# Patient Record
Sex: Male | Born: 1975 | Race: White | Hispanic: No | Marital: Married | Smoking: Never smoker
Health system: Southern US, Community
[De-identification: ages and names within clinical notes are randomized; demographics above are authoritative.]

## PROBLEM LIST (undated history)

## (undated) DIAGNOSIS — N201 Calculus of ureter: Secondary | ICD-10-CM

## (undated) DIAGNOSIS — Z87442 Personal history of urinary calculi: Secondary | ICD-10-CM

## (undated) DIAGNOSIS — N1330 Unspecified hydronephrosis: Secondary | ICD-10-CM

## (undated) DIAGNOSIS — E119 Type 2 diabetes mellitus without complications: Secondary | ICD-10-CM

## (undated) DIAGNOSIS — N200 Calculus of kidney: Secondary | ICD-10-CM

## (undated) DIAGNOSIS — R350 Frequency of micturition: Secondary | ICD-10-CM

## (undated) DIAGNOSIS — R351 Nocturia: Secondary | ICD-10-CM

## (undated) DIAGNOSIS — R3915 Urgency of urination: Secondary | ICD-10-CM

## (undated) HISTORY — DX: Type 2 diabetes mellitus without complications: E11.9

---

## 2003-09-23 HISTORY — PX: OTHER SURGICAL HISTORY: SHX169

## 2010-10-04 ENCOUNTER — Emergency Department (HOSPITAL_COMMUNITY)
Admission: EM | Admit: 2010-10-04 | Discharge: 2010-10-04 | Payer: Self-pay | Source: Home / Self Care | Admitting: Emergency Medicine

## 2010-10-23 HISTORY — PX: BICEPS TENDON REPAIR: SHX566

## 2011-04-08 ENCOUNTER — Encounter: Payer: Self-pay | Admitting: Family Medicine

## 2011-04-08 DIAGNOSIS — B009 Herpesviral infection, unspecified: Secondary | ICD-10-CM | POA: Insufficient documentation

## 2011-09-18 ENCOUNTER — Other Ambulatory Visit (HOSPITAL_COMMUNITY): Payer: Self-pay | Admitting: Urology

## 2011-09-18 ENCOUNTER — Ambulatory Visit (HOSPITAL_COMMUNITY)
Admission: RE | Admit: 2011-09-18 | Discharge: 2011-09-18 | Disposition: A | Discharge status: Home / Self Care | Payer: BC Managed Care – PPO | Source: Ambulatory Visit | Attending: Urology | Admitting: Urology

## 2011-09-18 DIAGNOSIS — N200 Calculus of kidney: Secondary | ICD-10-CM | POA: Insufficient documentation

## 2011-09-18 DIAGNOSIS — R109 Unspecified abdominal pain: Secondary | ICD-10-CM | POA: Insufficient documentation

## 2012-01-13 ENCOUNTER — Other Ambulatory Visit: Payer: Self-pay | Admitting: Urology

## 2012-01-16 ENCOUNTER — Encounter (HOSPITAL_BASED_OUTPATIENT_CLINIC_OR_DEPARTMENT_OTHER): Payer: Self-pay | Admitting: *Deleted

## 2012-01-16 NOTE — Progress Notes (Signed)
NPO AFTER MN. ARRIVES AT 0915. NEEDS HG. MAKE TAKE PERCOCET IF NEEDED W/ SIPS OF WATER.

## 2012-01-19 NOTE — H&P (Addendum)
History of Present Illness          F/u left nephrolithiasis and right ureteral stone.   CT Dec 2012 images reviewed but only the delayed imaging loaded. By report there was a 7 mm Rt UPJ stone, 4 mm Left UPJ stone and large LLP stone. No left obstruction. It's difficult to see the stones because of the contrast. Also there are clips in the region of the left UPJ which make it more difficult to interpret the KUB.   Pt underwent right ESWL Jan 2013 by Dr. Frann Rider for a 7 mm right UPJ stone. He was told the stone broke in to two pieces. KUB Mar 2013 revealed a fragment in right distal ureter and a 27 x 16 mm LLP stone, possible stone at left UPJ.    He had surgery for left UPJ obs and stone about 20 yrs ago. He has a large left flank incision. Dr. Frann Rider also performed this surgery.    He drives a delivery truck -- caskets.   Interval Hx Patient has been having more frequent intermittent right flank pain. The pain is becoming more intense.   KUB Jan 13, 2012 - stone in right distal ureter is advanced slightly toward right ureterovesical junction. Again possible left UPJ stone. Large left lower pole stone stable.  Renal ultrasound - see technician sheet for details, comparison CT December 2012, multiple KUBs-findings bilateral hydronephrosis right greater than left.   Past Medical History Problems  1. History of  Esophageal Reflux 530.81 2. History of  Nephrolithiasis V13.01  Surgical History Problems  1. History of  Appendectomy 2. History of  Arm Incision 3. History of  Kidney Surgery 4. History of  Leg Incision  (Below Knee) 5. History of  Lithotripsy 6. History of  Nose Surgery  Current Meds 1. Oxycodone-Acetaminophen 5-325 MG Oral Tablet; TAKE 1 TO 2 TABLETS EVERY 4 TO 6 HOURS  AS NEEDED FOR PAIN; Therapy: 11Feb2013 to (Evaluate:13Feb2013); Last Rx:11Feb2013 2. Oxycodone-Acetaminophen 7.5-325 MG Oral Tablet; Therapy: 02Jan2013 to 3. Tamsulosin HCl 0.4 MG Oral Capsule; TAKE 1  CAPSULE Daily; Therapy: 11Feb2013 to  (Evaluate:14Jun2013)  Requested for: 15Apr2013; Last Rx:15Apr2013  Allergies Medication  1. Hydrocodone-Acetaminophen CAPS  Family History Problems  1. Family history of  Family Health Status Number Of Children 2 sons and 1 daughter 2. Paternal history of  Hematuria 3. Paternal history of  Nephrolithiasis  Social History Problems  1. Marital History - Currently Married 2. Never A Smoker 3. Occupation: Truck Psychologist, occupational  4. History of  Alcohol Use 5. History of  Caffeine Use 6. History of  Tobacco Use 305.1  Results/Data Urine [Data Includes: Last 1 Day]   23Apr2013  COLOR YELLOW   APPEARANCE CLEAR   SPECIFIC GRAVITY 1.020   pH 5.0   GLUCOSE NEG mg/dL  BILIRUBIN NEG   KETONE NEG mg/dL  BLOOD TRACE   PROTEIN NEG mg/dL  UROBILINOGEN 0.2 mg/dL  NITRITE NEG   LEUKOCYTE ESTERASE NEG   SQUAMOUS EPITHELIAL/HPF NONE SEEN   WBC 0-3 WBC/hpf  RBC 0-3 RBC/hpf  BACTERIA NONE SEEN   CRYSTALS NONE SEEN   CASTS NONE SEEN    Assessment Assessed  1. Nephrolithiasis 592.0 2. Ureteral Stone 592.1 3. Hydronephrosis 591  Plan Nephrolithiasis (592.0), Ureteral Stone (592.1), Hydronephrosis (591)  1. Follow-up Schedule Surgery Office  Follow-up  Done: 23Apr2013  Discussion/Summary  Discuss with the patient and his wife the KUB and renal exam findings. I believe on the right he has a stone  in the distal ureter causing right hydronephrosis obstruction and pain.  On the left the hydronephrosis may be chronic. There may be a small stone in the left UPJ versus clips. In regards to the right ureteral stone and possible left UPJ stone we discussed the nature risks and benefits of right ureteroscopy laser lithotripsy of Stone basket extraction with stent placement. At the same time we could perform a left retrograde pyelogram possible left ureteroscopy laser lithotripsy and stent placement we discussed the left lower pole stone is too big to be  treated with ureteroscope. We discussed alternatives to the right ureteral stone such as continued some passage or shockwave lithotripsy. All questions answered vision looks to proceed with ureteroscopic management as above.  I was able to get the non-contrast images loaded from Dec 2012 CT. Comparing this with the KUB shows likely 8 clips just posterior to the left UPJ with chronic residual dilation of the left pelvis and collecting system. The left side is draining as seen on the CT delayed images.   H&P update: I have reviewed the history and examined the patient today.  There are no changes to the history and physical. He has not passed a stone.  Date: 01/20/2012 Time: 1048 Doyne Keel, MD

## 2012-01-20 ENCOUNTER — Ambulatory Visit (HOSPITAL_BASED_OUTPATIENT_CLINIC_OR_DEPARTMENT_OTHER)
Admission: RE | Admit: 2012-01-20 | Discharge: 2012-01-20 | Disposition: A | Discharge status: Home / Self Care | Payer: BC Managed Care – PPO | Source: Ambulatory Visit | Attending: Urology | Admitting: Urology

## 2012-01-20 ENCOUNTER — Encounter (HOSPITAL_BASED_OUTPATIENT_CLINIC_OR_DEPARTMENT_OTHER)
Admission: RE | Disposition: A | Discharge status: Home / Self Care | Payer: Self-pay | Source: Ambulatory Visit | Attending: Urology

## 2012-01-20 ENCOUNTER — Encounter (HOSPITAL_BASED_OUTPATIENT_CLINIC_OR_DEPARTMENT_OTHER): Payer: Self-pay | Admitting: Anesthesiology

## 2012-01-20 ENCOUNTER — Encounter (HOSPITAL_BASED_OUTPATIENT_CLINIC_OR_DEPARTMENT_OTHER): Payer: Self-pay | Admitting: *Deleted

## 2012-01-20 ENCOUNTER — Ambulatory Visit (HOSPITAL_BASED_OUTPATIENT_CLINIC_OR_DEPARTMENT_OTHER): Payer: BC Managed Care – PPO | Admitting: Anesthesiology

## 2012-01-20 DIAGNOSIS — N201 Calculus of ureter: Secondary | ICD-10-CM | POA: Insufficient documentation

## 2012-01-20 DIAGNOSIS — K219 Gastro-esophageal reflux disease without esophagitis: Secondary | ICD-10-CM | POA: Insufficient documentation

## 2012-01-20 DIAGNOSIS — N1330 Unspecified hydronephrosis: Secondary | ICD-10-CM | POA: Insufficient documentation

## 2012-01-20 DIAGNOSIS — N200 Calculus of kidney: Secondary | ICD-10-CM | POA: Insufficient documentation

## 2012-01-20 HISTORY — DX: Unspecified hydronephrosis: N13.30

## 2012-01-20 HISTORY — DX: Nocturia: R35.1

## 2012-01-20 HISTORY — DX: Calculus of kidney: N20.0

## 2012-01-20 HISTORY — PX: URETEROSCOPY: SHX842

## 2012-01-20 HISTORY — DX: Urgency of urination: R39.15

## 2012-01-20 HISTORY — DX: Personal history of urinary calculi: Z87.442

## 2012-01-20 HISTORY — DX: Calculus of ureter: N20.1

## 2012-01-20 HISTORY — DX: Frequency of micturition: R35.0

## 2012-01-20 SURGERY — URETEROSCOPY
Anesthesia: General | Site: Ureter | Wound class: Clean Contaminated

## 2012-01-20 MED ORDER — LACTATED RINGERS IV SOLN: INTRAVENOUS | Status: DC

## 2012-01-20 MED ORDER — IOHEXOL 350 MG/ML SOLN
INTRAVENOUS | Status: DC | PRN
  Administered 2012-01-20: 10 mL

## 2012-01-20 MED ORDER — FENTANYL CITRATE 0.05 MG/ML IJ SOLN: 25.0000 ug | INTRAMUSCULAR | Status: DC | PRN

## 2012-01-20 MED ORDER — MIDAZOLAM HCL 5 MG/5ML IJ SOLN
INTRAMUSCULAR | Status: DC | PRN
  Administered 2012-01-20: 2 mg via INTRAVENOUS

## 2012-01-20 MED ORDER — LIDOCAINE HCL (CARDIAC) 20 MG/ML IV SOLN
INTRAVENOUS | Status: DC | PRN
  Administered 2012-01-20: 80 mg via INTRAVENOUS

## 2012-01-20 MED ORDER — BELLADONNA ALKALOIDS-OPIUM 16.2-60 MG RE SUPP
RECTAL | Status: DC | PRN
  Administered 2012-01-20: 1 via RECTAL

## 2012-01-20 MED ORDER — PROMETHAZINE HCL 25 MG/ML IJ SOLN: 6.2500 mg | INTRAMUSCULAR | Status: DC | PRN

## 2012-01-20 MED ORDER — SODIUM CHLORIDE 0.9 % IR SOLN
Status: DC | PRN
  Administered 2012-01-20: 5000 mL

## 2012-01-20 MED ORDER — LACTATED RINGERS IV SOLN
INTRAVENOUS | Status: DC
  Administered 2012-01-20: 100 mL/h via INTRAVENOUS

## 2012-01-20 MED ORDER — OXYCODONE-ACETAMINOPHEN 5-325 MG PO TABS
1.0000 | ORAL_TABLET | ORAL | Status: DC | PRN
  Administered 2012-01-20: 1 via ORAL

## 2012-01-20 MED ORDER — CEFAZOLIN SODIUM-DEXTROSE 2-3 GM-% IV SOLR
2.0000 g | INTRAVENOUS | Status: AC
  Administered 2012-01-20: 2 g via INTRAVENOUS

## 2012-01-20 MED ORDER — SCOPOLAMINE 1 MG/3DAYS TD PT72
1.0000 | MEDICATED_PATCH | Freq: Once | TRANSDERMAL | Status: DC
  Administered 2012-01-20: 1.5 mg via TRANSDERMAL

## 2012-01-20 MED ORDER — ONDANSETRON HCL 4 MG/2ML IJ SOLN
INTRAMUSCULAR | Status: DC | PRN
  Administered 2012-01-20: 4 mg via INTRAVENOUS

## 2012-01-20 MED ORDER — LACTATED RINGERS IV SOLN
INTRAVENOUS | Status: DC | PRN
  Administered 2012-01-20: 10:00:00 via INTRAVENOUS

## 2012-01-20 MED ORDER — CEFAZOLIN SODIUM 1-5 GM-% IV SOLN: 1.0000 g | INTRAVENOUS | Status: DC

## 2012-01-20 MED ORDER — FENTANYL CITRATE 0.05 MG/ML IJ SOLN
INTRAMUSCULAR | Status: DC | PRN
  Administered 2012-01-20 (×2): 25 ug via INTRAVENOUS
  Administered 2012-01-20: 50 ug via INTRAVENOUS
  Administered 2012-01-20 (×4): 25 ug via INTRAVENOUS
  Administered 2012-01-20: 50 ug via INTRAVENOUS

## 2012-01-20 MED ORDER — PROPOFOL 10 MG/ML IV EMUL
INTRAVENOUS | Status: DC | PRN
  Administered 2012-01-20: 200 mg via INTRAVENOUS

## 2012-01-20 MED ORDER — CIPROFLOXACIN HCL 500 MG PO TABS: 500.0000 mg | ORAL_TABLET | Freq: Two times a day (BID) | ORAL | Status: AC

## 2012-01-20 MED ORDER — DEXAMETHASONE SODIUM PHOSPHATE 4 MG/ML IJ SOLN
INTRAMUSCULAR | Status: DC | PRN
  Administered 2012-01-20: 8 mg via INTRAVENOUS

## 2012-01-20 MED ORDER — KETOROLAC TROMETHAMINE 30 MG/ML IJ SOLN
INTRAMUSCULAR | Status: DC | PRN
  Administered 2012-01-20: 30 mg via INTRAVENOUS

## 2012-01-20 SURGICAL SUPPLY — 32 items
ADAPTER CATH URET PLST 4-6FR (CATHETERS) IMPLANT
BAG DRAIN URO-CYSTO SKYTR STRL (DRAIN) ×2 IMPLANT
BASKET LASER NITINOL 1.9FR (BASKET) IMPLANT
BASKET STNLS GEMINI 4WIRE 3FR (BASKET) ×2 IMPLANT
BASKET ZERO TIP NITINOL 2.4FR (BASKET) IMPLANT
BRUSH URET BIOPSY 3F (UROLOGICAL SUPPLIES) IMPLANT
CANISTER SUCT LVC 12 LTR MEDI- (MISCELLANEOUS) IMPLANT
CATH INTERMIT  6FR 70CM (CATHETERS) ×2 IMPLANT
CATH URET 5FR 28IN CONE TIP (BALLOONS)
CATH URET 5FR 28IN OPEN ENDED (CATHETERS) IMPLANT
CATH URET 5FR 70CM CONE TIP (BALLOONS) IMPLANT
CLOTH BEACON ORANGE TIMEOUT ST (SAFETY) ×2 IMPLANT
DRAPE CAMERA CLOSED 9X96 (DRAPES) IMPLANT
ELECT REM PT RETURN 9FT ADLT (ELECTROSURGICAL)
ELECTRODE REM PT RTRN 9FT ADLT (ELECTROSURGICAL) IMPLANT
GLOVE BIO SURGEON STRL SZ7.5 (GLOVE) ×2 IMPLANT
GOWN PREVENTION PLUS LG XLONG (DISPOSABLE) ×2 IMPLANT
GOWN STRL REIN XL XLG (GOWN DISPOSABLE) ×2 IMPLANT
GUIDEWIRE 0.038 PTFE COATED (WIRE) IMPLANT
GUIDEWIRE ANG ZIPWIRE 038X150 (WIRE) IMPLANT
GUIDEWIRE STR DUAL SENSOR (WIRE) ×2 IMPLANT
IV NS IRRIG 3000ML ARTHROMATIC (IV SOLUTION) ×6 IMPLANT
KIT BALLIN UROMAX 15FX10 (LABEL) IMPLANT
KIT BALLN UROMAX 15FX4 (MISCELLANEOUS) IMPLANT
KIT BALLN UROMAX 26 75X4 (MISCELLANEOUS)
LASER FIBER DISP (UROLOGICAL SUPPLIES) ×2 IMPLANT
PACK CYSTOSCOPY (CUSTOM PROCEDURE TRAY) ×2 IMPLANT
SET HIGH PRES BAL DIL (LABEL)
SHEATH ACCESS URETERAL 38CM (SHEATH) ×2 IMPLANT
SHEATH URET ACCESS 12FR/35CM (UROLOGICAL SUPPLIES) IMPLANT
SHEATH URET ACCESS 12FR/55CM (UROLOGICAL SUPPLIES) IMPLANT
SYRINGE IRR TOOMEY STRL 70CC (SYRINGE) IMPLANT

## 2012-01-20 NOTE — Anesthesia Procedure Notes (Signed)
Procedure Name: LMA Insertion Date/Time: 01/20/2012 11:05 AM Performed by: Renella Cunas D Pre-anesthesia Checklist: Patient identified, Emergency Drugs available, Suction available and Patient being monitored Patient Re-evaluated:Patient Re-evaluated prior to inductionOxygen Delivery Method: Circle System Utilized Preoxygenation: Pre-oxygenation with 100% oxygen Intubation Type: IV induction Ventilation: Mask ventilation without difficulty LMA: LMA inserted LMA Size: 4.0 Number of attempts: 1 Airway Equipment and Method: bite block Placement Confirmation: positive ETCO2 Tube secured with: Tape Dental Injury: Teeth and Oropharynx as per pre-operative assessment

## 2012-01-20 NOTE — Anesthesia Preprocedure Evaluation (Addendum)
Anesthesia Evaluation  Patient identified by MRN, date of birth, ID band Patient awake    Reviewed: Allergy & Precautions, H&P , NPO status , Patient's Chart, lab work & pertinent test results  Airway Mallampati: III TM Distance: >3 FB Neck ROM: Full    Dental  (+) Teeth Intact and Dental Advisory Given   Pulmonary neg pulmonary ROS,  breath sounds clear to auscultation  Pulmonary exam normal       Cardiovascular negative cardio ROS  Rhythm:Regular Rate:Normal     Neuro/Psych negative neurological ROS  negative psych ROS   GI/Hepatic negative GI ROS, Neg liver ROS,   Endo/Other  Morbid obesity  Renal/GU Bilateral hydronephrosis secondary renal calculi  negative genitourinary   Musculoskeletal negative musculoskeletal ROS (+)   Abdominal (+) + obese,   Peds  Hematology negative hematology ROS (+)   Anesthesia Other Findings   Reproductive/Obstetrics negative OB ROS                          Anesthesia Physical Anesthesia Plan  ASA: II  Anesthesia Plan: General   Post-op Pain Management:    Induction: Intravenous  Airway Management Planned: LMA  Additional Equipment:   Intra-op Plan:   Post-operative Plan: Extubation in OR  Informed Consent: I have reviewed the patients History and Physical, chart, labs and discussed the procedure including the risks, benefits and alternatives for the proposed anesthesia with the patient or authorized representative who has indicated his/her understanding and acceptance.   Dental advisory given  Plan Discussed with: CRNA  Anesthesia Plan Comments:         Anesthesia Quick Evaluation

## 2012-01-20 NOTE — Brief Op Note (Signed)
01/20/2012  11:58 AM  PATIENT:  Dez D Brisbin  36 y.o. male  PRE-OPERATIVE DIAGNOSIS:  BILATERAL HYDRONEPHROSIS AND RIGHT URETERAL STONE  POST-OPERATIVE DIAGNOSIS:  BILATERAL HYDRONEPHROSIS AND RIGHT URETERAL STONE  PROCEDURE:  Procedure(s) (LRB): Right URETEROSCOPY (N/A), right laser lithotripsy stone basket extraction and right ureteral stent placement  Bilateral retrograde pyelogram  SURGEON:  Surgeon(s) and Role:    * Antony Haste, MD - Primary  ANESTHESIA:   general  EBL: Minimal  BLOOD ADMINISTERED:none  DRAINS: Right 6 x 26 cm double-J stent with string  LOCAL MEDICATIONS USED:  NONE  SPECIMEN:  Stone fragment  DISPOSITION OF SPECIMEN:  Lab  COUNTS:  YES  TOURNIQUET:  * No tourniquets in log *  DICTATION: [id number]  PLAN OF CARE: Discharge to home after PACU  PATIENT DISPOSITION:  PACU - hemodynamically stable.   Delay start of Pharmacological VTE agent (>24hrs) due to surgical blood loss or risk of bleeding: not applicable

## 2012-01-20 NOTE — Transfer of Care (Signed)
Immediate Anesthesia Transfer of Care Note  Patient: Lucas Contreras  Procedure(s) Performed: Procedure(s) (LRB): URETEROSCOPY (N/A)  Patient Location: PACU  Anesthesia Type: General  Level of Consciousness: awake, alert  and oriented  Airway & Oxygen Therapy: Patient Spontanous Breathing and Patient connected to face mask oxygen  Post-op Assessment: Report given to PACU RN and Post -op Vital signs reviewed and stable  Post vital signs: Reviewed and stable  Complications: No apparent anesthesia complications

## 2012-01-20 NOTE — Discharge Instructions (Signed)
Right Ureteroscopy and Right Ureteral Stent A ureteral stent is a soft plastic tube with multiple holes. The stent is inserted into a ureter to help drain urine from the kidney into the bladder. The tube has a coil on each end to keep it from falling out. One end stays in the kidney. The other end stays in the bladder. A stent cannot be seen from the outside. Usually it does not keep you from going about normal routines. A ureteral stent is used to bypass a blockage in your kidney or ureter. This blockage can be caused by kidney stones, scar tissue, pregnancy, or other causes. It can also be used during treatment to remove a kidney stone or to let a ureter heal after surgery. The stent allows urine to drain from the kidney into the bladder. It is most often taken out after the blockage has been removed or the ureter has healed. If a stent is needed for a long time, it will be changed every few months. INSERTING THE STENT Your stent is put in by a urologist. This is a medical doctor trained for treating genitourinary (kidney, ureter and bladder) problems. The stent is inserted in a hospital or same day surgical center. You can anticipate going home the same day. PROCEDURE  A special x-ray machine called a fluoroscope is used to guide the insertion of your stent. This allows your doctor to make sure the stent is in the correct place.   First you are given anesthesia to keep you comfortable.   Then your doctor inserts a special lighted instrument called a cystoscope into your bladder. This allows your doctor to see the opening to the ureter.   A thin wire is carefully threaded into the bladder and up the ureter. The stent is inserted over the wire and the wire is then removed.  HOME CARE INSTRUCTIONS   While the stent is in place, you may feel some discomfort. Certain movements may trigger pain or a feeling that you need to urinate. Your caregiver may give you pain medication. Only take over-the-counter  or prescription medicines for pain, discomfort, or fever as directed by your caregiver. Do not take aspirin as this can make bleeding worse.   You may be given medications to prevent infection or bladder spasms. Be sure to take all medications as directed.   Drink plenty of fluids.   You may have small amounts of bleeding causing your urine to be slightly red. This is nothing to be concerned about.   REMOVAL OF THE STENT ***Remove the stent on Thursday, 01/22/2012 by pulling the string was slow steady pressure. Remove the entire stent.***  SEEK IMMEDIATE MEDICAL CARE IF:   Your urine is dark red or has blood clots.   You are incontinent (leaking urine).   You have an oral temperature above 102 F (38.9 C), chills, nausea (feeling sick to your stomach), or vomiting.   Your pain is not relieved by pain medication. Do not take aspirin as this can make bleeding worse.   The end of the stent comes out of the urethra.   Post Anesthesia Home Care Instructions  Activity: Get plenty of rest for the remainder of the day. A responsible adult should stay with you for 24 hours following the procedure.  For the next 24 hours, DO NOT: -Drive a car -Advertising copywriter -Drink alcoholic beverages -Take any medication unless instructed by your physician -Make any legal decisions or sign important papers.  Meals: Start with liquid  foods such as gelatin or soup. Progress to regular foods as tolerated. Avoid greasy, spicy, heavy foods. If nausea and/or vomiting occur, drink only clear liquids until the nausea and/or vomiting subsides. Call your physician if vomiting continues.  Special Instructions/Symptoms: Your throat may feel dry or sore from the anesthesia or the breathing tube placed in your throat during surgery. If this causes discomfort, gargle with warm salt water. The discomfort should disappear within 24 hours.

## 2012-01-20 NOTE — Anesthesia Postprocedure Evaluation (Signed)
Anesthesia Post Note  Patient: Lucas Contreras  Procedure(s) Performed: Procedure(s) (LRB): URETEROSCOPY (N/A)  Anesthesia type: General  Patient location: PACU  Post pain: Pain level controlled  Post assessment: Post-op Vital signs reviewed  Last Vitals:  Filed Vitals:   01/20/12 1212  BP: 138/85  Pulse: 89  Temp: 36.3 C  Resp: 18    Post vital signs: Reviewed  Level of consciousness: sedated  Complications: No apparent anesthesia complications

## 2012-01-21 ENCOUNTER — Encounter (HOSPITAL_BASED_OUTPATIENT_CLINIC_OR_DEPARTMENT_OTHER): Payer: Self-pay | Admitting: Urology

## 2012-01-21 NOTE — Op Note (Signed)
NAMESHAHEER, BONFIELD                 ACCOUNT NO.:  [id number]  MEDICAL RECORD NO.:  [id number]  LOCATION:                               FACILITY:  [facility]  PHYSICIAN:  Jerilee Field, MD   DATE OF BIRTH:  02-02-1976  DATE OF PROCEDURE: DATE OF DISCHARGE:                              OPERATIVE REPORT   PREOPERATIVE DIAGNOSES: 1. Right ureteral stone. 2. Bilateral hydronephrosis.  POSTOPERATIVE DIAGNOSES: 1. Right ureteral stone. 2. Bilateral hydronephrosis.  PROCEDURES: 1. Right ureteroscopy, right laser lithotripsy with stone basket     extraction and right ureteral stent placement. 2. Bilateral retrograde pyelogram.  SURGEON:  Jerilee Field, MD  ANESTHESIA:  General.  INDICATION FOR PROCEDURE:  Mr. Kulikowski is a 36 year old white male, who underwent right extracorporeal shock wave lithotripsy for right proximal stone several months ago.  He has been tolerating stone passage and wanted to pass the residual fragment without the need for further intervention until recently when he has developed intermittent significant right flank pain.  We discussed the nature, risks, and benefits of cystoscopy, right ureteroscopy, laser lithotripsy with stone basket extraction, including alternatives such as continuing on treatment or repeat shockwave lithotripsy.  All questions were answered and he elected to proceed with right ureteroscopy.  On CT scan, the radiologist commented on a left UPJ stone; however, there were 8 metal clips in the region of the UPJ on imaging and KUB and no stones.  The patient has some mild intermittent left flank pain.  He had a UPJ repair on the left as a teenager.  Therefore, we discussed left retrograde pyelogram and left ureteroscopy for diagnostic purposes with possible stone manipulation on the left side as well.  All questions were answered and the patient elected to proceed with all these procedures.  FINDINGS:  On cystoscopy:  The urethra, prostate,  and bladder were normal. On right ureteroscopy:  A sizable 7-mm stone was found at the right ureterovesical junction.  It was quite hard and required laser lithotripsy to break up with the final piece removed with [employer]. Right retrograde pyelogram:  The right ureter appeared to have mild dilation without filling defect or stricture.  The right collecting system and renal pelvis showed mild dilation without filling defect. Left retrograde pyelogram:  The left ureter was normal in appearance without filling defect, stricture, or dilation.  The ureter narrowed at the left ureteropelvic junction with a fine stream of contrast going into the left renal pelvis.  The left collecting system and renal pelvis were mildly dilated, but had no filling defect.  There was a sizable left lower pole stone.  DESCRIPTION OF PROCEDURE:  After consent was obtained, the patient was brought to the operating room.  A time-out was performed to confirm the patient and procedure.  After adequate anesthesia, he was placed in lithotomy position, and prepped and draped in the usual fashion.  A cystoscope was passed per urethra.  Scout imaging was obtained of the right kidney, ureter, and bladder region and a calcification was noted at the right ureterovesical junction.  I attempted to obtain a right retrograde pyelogram with the 6-French catheter would not cannulate the right ureteral  orifice.  Therefore, a Sensor wire was advanced into the right ureterovesical junction and noted to pass along and passed the calcification on x-ray.  The wire was then advanced and coiled up in the right upper pole.  This bladder was drained and the cystoscope was removed.  The semi-rigid ureteroscope was then advanced adjacent to the wire.  The ureterovesical junction was entered and carefully dilated with the scope and just proximal to this, a large jackstone was noted. A photo was taken and a 365 micron laser fiber was  advanced.  At a setting of 0.5 and 15, the stone was fragmented into small pieces. There was 1 final piece that began to move proximally in the ureter. Therefore, it was surrounded with a [employer] and removed.  The ureteroscope was then advanced back into the ureter.  The stone impaction site was quite edematous, but proximal to this the ureter appeared normal without dilation.  A right retrograde pyelogram was obtained with retrograde injection of contrast through the ureteroscope and the remainder of the ureter proximal to the scope appeared normal as previously dictated.  The ureter was then carefully inspected on the way out and apart from some edema where the stone had been impacted, there was no injury or stone fragments remaining.  The scope was then removed. The cystoscope was readvanced into the bladder and the bladder drained. Using a 6-French open-ended catheter, left retrograde pyelogram was obtained as previously dictated on scout imaging of the left kidney, ureter, and bladder.  There was a large calcification noted in the region of the left lower pole on fluoro.  The Sensor wire was then advanced, coiled up in the left upper pole.  It easily went up the ureter through the UPJ and coiled in the upper pole of the left kidney. I attempted to pass the digital ureteroscope over the wire, but it would not advance.  I then attempted to dilate the ureter gently with the inner cannula of an access sheath, this would not advance either. Having reviewed the CT and carefully looked at the KUBs, it was obvious there were 8 clips around the left UPJ and no stones.  It appears there was some residual chronic dilation of the left kidney, which may require surveillance.  However, the left ureteropelvic junction is patent and this was also evidenced on the patient's CT scan in December 2012 when contrast advanced down the ureter on the left in a normal fashion on delayed imaging.  The  right Sensor wire was then backloaded on the cystoscope and a right 6 x 26 cm stent was placed.  The bladder was drained and the scope removed.  A good coil was seen in the kidney and a good coil in the bladder.  The string was left on the stent.  The patient was then awakened, taken to recovery room in stable condition.  ESTIMATED BLOOD LOSS:  Minimal.  COMPLICATIONS:  None.  DRAINS:  Right 6 x 26 cm double-J stent.  SPECIMEN:  Stone fragments, sent to the lab.  DISPOSITION:  Patient stable to PACU.          ______________________________ Jerilee Field, MD    ME/MEDQ  D:  01/20/2012  T:  01/21/2012  Job:  [id number]

## 2012-01-26 ENCOUNTER — Encounter (HOSPITAL_BASED_OUTPATIENT_CLINIC_OR_DEPARTMENT_OTHER): Payer: Self-pay

## 2013-02-18 ENCOUNTER — Encounter: Payer: Self-pay | Admitting: Physician Assistant

## 2013-02-18 ENCOUNTER — Ambulatory Visit (INDEPENDENT_AMBULATORY_CARE_PROVIDER_SITE_OTHER): Payer: BC Managed Care – PPO | Admitting: Physician Assistant

## 2013-02-18 VITALS — BP 136/94 | HR 68 | Temp 97.7°F | Resp 18 | Ht 65.25 in | Wt 278.0 lb

## 2013-02-18 DIAGNOSIS — M79604 Pain in right leg: Secondary | ICD-10-CM

## 2013-02-18 DIAGNOSIS — M79609 Pain in unspecified limb: Secondary | ICD-10-CM

## 2013-02-18 NOTE — Progress Notes (Signed)
   Patient ID: CHEICK SUHR MRN: [id number], DOB: [birth year], 37 y.o. Date of Encounter: 02/18/2013, 4:58 PM    Chief Complaint:  Chief Complaint  Patient presents with  . Pain in right upper thigh     HPI: 37 y.o. year old male reports that about one week ago he noticed some pain near his right knee. Since then, he has noticed that medial aspect of right thigh "felt sore like a bruise" but there was no bruise there. Says he just wanted to get it checked b/c years ago he was hit by a ball in the left leg and developed a hematoma. Wasn't sure if something like that was going to happen. Did not know if had been bitten by a spider or something or what. Has had no itching. No redness, no swelling.    Home Meds: See attached medication section for any medications that were entered at today's visit. The computer does not put those onto this list.The following list is a list of meds entered prior to today's visit.   Current Outpatient Prescriptions on File Prior to Visit  Medication Sig Dispense Refill  . ibuprofen (ADVIL,MOTRIN) 200 MG tablet Take 200 mg by mouth every 6 (six) hours as needed.       No current facility-administered medications on file prior to visit.    Allergies: No Known Allergies    Review of Systems: See HPI for pertinent ROS. All other ROS negative.    Physical Exam: Blood pressure 136/94, pulse 68, temperature 97.7 F (36.5 C), temperature source Oral, resp. rate 18, height 5' 5.25" (1.657 m), weight 278 lb (126.1 kg)., Body mass index is 45.93 kg/(m^2). General: WNWD WM. Appears in no acute distress. Lungs: Clear bilaterally to auscultation without wheezes, rales, or rhonchi. Breathing is unlabored. Heart: Regular rhythm. No murmurs, rubs, or gallops. Msk:  Strength and tone normal for age. Extremities/Skin: Right Knee: Inspection is normal. Right thigh: Inspection is normal. There is no erythema, no swelling, no rash or skin lesion. Palpation: There is  tenderness at area medial and superior to knee. There is also area of tenderness with palpation of medial upper thigh.  Neuro: Alert and oriented X 3. Moves all extremities spontaneously. Gait is normal. CNII-XII grossly in tact. Psych:  Responds to questions appropriately with a normal affect.     ASSESSMENT AND PLAN:  37 y.o. year old male with  1. Right leg pain His pain and symptoms are c/w muscle strain. Rec stretching with lunges etc. F/U if symtoms change or worsen.   [address] [place], [place], [facility] 02/18/2013 4:58 PM

## 2013-06-07 ENCOUNTER — Telehealth: Payer: Self-pay | Admitting: Physician Assistant

## 2013-06-07 NOTE — Telephone Encounter (Signed)
Last OV 02/18/13.  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  Asking for refills??

## 2013-06-07 NOTE — Telephone Encounter (Signed)
Valtrex 1000 mg (pt last got in 2012, but he said that [facility] told him to call at his last visit if he ever needed a refill)

## 2013-06-08 MED ORDER — ONE-DAILY MULTI VITAMINS PO TABS: ORAL_TABLET | ORAL | Status: DC

## 2013-06-08 NOTE — Telephone Encounter (Signed)
Approved Valtrex 1000 mg 2 by mouth every 12 hours x2 doses #4 with 5 refills

## 2013-06-08 NOTE — Telephone Encounter (Signed)
Med sent to pharm..Patient aware

## 2013-06-10 ENCOUNTER — Telehealth: Payer: Self-pay | Admitting: Family Medicine

## 2013-06-10 MED ORDER — VALACYCLOVIR HCL 1 G PO TABS: 2000.0000 mg | ORAL_TABLET | Freq: Two times a day (BID) | ORAL | Status: DC

## 2013-06-10 NOTE — Telephone Encounter (Signed)
Pt aware.

## 2013-06-10 NOTE — Telephone Encounter (Signed)
Please void multivitamin and send in Valtrex. I told the pt we would call him when we got it fixed, so if you want me to call him let me know.

## 2013-06-10 NOTE — Telephone Encounter (Signed)
Voided MVI rx and new Valtrex escribed

## 2013-12-05 ENCOUNTER — Ambulatory Visit (INDEPENDENT_AMBULATORY_CARE_PROVIDER_SITE_OTHER): Payer: BC Managed Care – PPO | Admitting: Family Medicine

## 2013-12-05 ENCOUNTER — Encounter: Payer: Self-pay | Admitting: Family Medicine

## 2013-12-05 VITALS — BP 148/76 | HR 82 | Temp 98.3°F | Resp 16 | Ht 65.0 in | Wt 276.0 lb

## 2013-12-05 DIAGNOSIS — F411 Generalized anxiety disorder: Secondary | ICD-10-CM | POA: Insufficient documentation

## 2013-12-05 DIAGNOSIS — G4700 Insomnia, unspecified: Secondary | ICD-10-CM

## 2013-12-05 DIAGNOSIS — J019 Acute sinusitis, unspecified: Secondary | ICD-10-CM | POA: Insufficient documentation

## 2013-12-05 DIAGNOSIS — F39 Unspecified mood [affective] disorder: Secondary | ICD-10-CM | POA: Insufficient documentation

## 2013-12-05 MED ORDER — FLUTICASONE PROPIONATE 50 MCG/ACT NA SUSP
2.0000 | Freq: Every day | NASAL | Status: DC
Start: 2013-12-05 — End: 2020-12-11

## 2013-12-05 MED ORDER — AMOXICILLIN 500 MG PO CAPS: 500.0000 mg | ORAL_CAPSULE | Freq: Three times a day (TID) | ORAL | Status: DC

## 2013-12-05 NOTE — Assessment & Plan Note (Signed)
Would try melatonin for sleep

## 2013-12-05 NOTE — Patient Instructions (Addendum)
Melatonin start tonight 1-2 capsules, take 1 hour before bedtime I will check into the happy camper and another alternative medication Take antibiotics / flonase, Mucinex  F/U As needed

## 2013-12-05 NOTE — Progress Notes (Signed)
Patient ID: Lucas Contreras, male   DOB: 09/15/1976, 38 y.o.   MRN: [id number]   Subjective:    Patient ID: Lucas Berkshireocky D Contreras, male    DOB: 04/09/1976, 38 y.o.   MRN: [id number]  Patient presents for Illness and anxiety issues  patient here with sinus drainage pressure for the past 3 weeks. He's also had subjective fever. He had some cough from postnasal drip. He he has tried some over-the-counter medications with minimal improvement  He is also here about anxiety and stress. He works in a [employer] business and is on call 24 hours a day he is working like this for the past 6 years. His mood has worsened over past 3-6 months. Anytime his phone rings he actually becomes very angry and upset he feels like he just wants to throw his business phone. He is now more jumpy and very curable at home. He is an 38 year old son and he has noticed his other reactions to his son new onset. His wife is also concerned because he seems stressed and angry all the time. His sleep is also impaired and he will only sleep a good 4 -5 hours a night. He does not want to take any prescription medications because of all the side effects he did inquire about an herbal called happy camper/    Review Of Systems:  GEN- denies fatigue, fever, weight loss,weakness, recent illness HEENT- denies eye drainage, change in vision,+ nasal discharge, CVS- denies chest pain, palpitations RESP- denies SOB, +cough, wheeze Neuro- denies headache, dizziness, syncope, seizure activity       Objective:    BP 148/76  Pulse 82  Temp(Src) 98.3 F (36.8 C)  Resp 16  Ht 5\' 5"  (1.651 m)  Wt 276 lb (125.193 kg)  BMI 45.93 kg/m2  GEN- NAD, alert and oriented x3 HEENT- PERRL, EOMI, non injected sclera, pink conjunctiva, MMM, oropharynx mild injection, TM clear bilat no effusion,  + maxillary sinus tenderness, inflammed turbinates, + Nasal drainage  Neck- Supple, no LAD CVS- RRR, no murmur RESP-CTAB EXT- No edema Pulses- Radial 2+ Psych-  normal affect and mood, well groomed, polite no apparent SI         Assessment & Plan:      Problem List Items Addressed This Visit   None      Note: This dictation was prepared with Dragon dictation along with smaller phrase technology. Any transcriptional errors that result from this process are unintentional.

## 2013-12-05 NOTE — Assessment & Plan Note (Signed)
His current state waited around his job which unfortunately we cannot change at this time. We did discuss therapy versus medications and he wants to try something more natural. I will look into the herbals that he mentioned to see if this is something that he would benefit from. I did discuss the typical as SSRIs as well as SNRI with him and he wishes to hold off at this time, he has a strong family history of addiction therefore does not want any addictive medication such as benzodiazepines which I agree with

## 2013-12-05 NOTE — Assessment & Plan Note (Signed)
Treat with Amox, flonase, mucinex based on duration , no improvement in symptoms

## 2013-12-11 ENCOUNTER — Telehealth: Payer: Self-pay | Admitting: Family Medicine

## 2013-12-11 NOTE — Telephone Encounter (Signed)
Please call pt  I spoke with one of the alternative medicine physicians, I do not have any good evidence behind any of these herbal medications as they are not prescribed medications  He recommends an herbal call Ashwaganda for stress and mood- it is found on [employer] , another is [employer]m- comes in a tea and a capsule, I am not sure of any specific side effects with these   Continue Melatonin for sleep this is safe   Regarding the happy camper is does not have marijuana in it, it does have an herb called KAVA KAVA in it which is known to cause some liver damage in some people, so if does try this he needs to have his liver checked after 6 weeks, then periodically.   If he wants to try prescription meds instead let us know

## 2013-12-12 NOTE — Telephone Encounter (Signed)
LMTRC

## 2013-12-13 ENCOUNTER — Other Ambulatory Visit: Payer: Self-pay | Admitting: *Deleted

## 2013-12-13 DIAGNOSIS — Z0000 Encounter for general adult medical examination without abnormal findings: Secondary | ICD-10-CM

## 2013-12-13 NOTE — Telephone Encounter (Signed)
Call placed to patient and patient made aware.   Reports that he has been taking Happy Camper x 3 days and he does notice improvement.   Advised to come in for Liver panel this week.   Liver panel ordered.

## 2014-08-30 ENCOUNTER — Ambulatory Visit (INDEPENDENT_AMBULATORY_CARE_PROVIDER_SITE_OTHER): Payer: BC Managed Care – PPO | Admitting: Physician Assistant

## 2014-08-30 ENCOUNTER — Encounter: Payer: Self-pay | Admitting: Physician Assistant

## 2014-08-30 VITALS — BP 112/94 | HR 84 | Temp 98.1°F | Resp 18 | Ht 67.0 in | Wt 287.0 lb

## 2014-08-30 DIAGNOSIS — F411 Generalized anxiety disorder: Secondary | ICD-10-CM | POA: Insufficient documentation

## 2014-08-30 MED ORDER — SERTRALINE HCL 50 MG PO TABS: 50.0000 mg | ORAL_TABLET | Freq: Every day | ORAL | Status: DC

## 2014-08-30 NOTE — Progress Notes (Signed)
Patient ID: Lucas BerkshireRocky D Contreras MRN: [id number], DOB: 03/27/1976, 38 y.o. Date of Encounter: @DATE @  Chief Complaint:  Chief Complaint  Patient presents with  . Anxiety    HPI: 38 y.o. year old white male  presents for follow-up office visit to discuss anxiety/stress.  He says that when he was here to see Dr. Jeanice Limurham in March for a sinus infection that he discussed this with her. I have reviewed her note today as well.  He works with a [employer] business and is on call 24 hours a day. He says that at work he gets upset with his coworkers and yells at them and goes off on them. Says after the fact he realizes that he should've handled the situation in a better way.  He says that his son is very active with sports. He says the baseball season is about to start up and he knows that he needs to get on som, treatment prior to that. Says that in the past he has gotten into altercations and has yelled and screamed at baseball games and has gotten into confrontations with referrees.  Again he says that after the fact he realizes that he was being inappropriate. Says that just when he gets into these situations, things come out without him unable to control it.  Says that he scheduled this visit because he is afraid that he is going to lose his job or end up with a divorce if he does not to do something.  Says that when he talked to Dr. Jeanice Limurham about this at the visit in March he was scared to take prescription medications. He went to [employer] and used an herbal medication but this did not work. He says that he is still very scared of taking any type of medications but definitely does not want to lose his wife or lose his job.  Says that he now realizes that he is also snapping on his wife and his son over very minor things. At work he says that he feels that his coworkers do not take things seriously. Says that he has to have that job for financial income. Says that he gets upset because one of them  and is older and is about to be retired so he feels that that worker does not have to take things seriously because he is not dependent on the income. Says that the other worker [place]".   Says that just yesterday he came home from work upset and as soon as his wife saw him she discussed the fact " he had had a bad day"  He says that his wife was going to come with him to the appointment today that she ended up having to go to the dentist herself.  Pt  says that he feels that all of this is stemming from feeling stressed and anxious all the time.  He says that he has gained 40 pounds over the past year and he is going to work on diet and exercise  But knows that he needs to get on this medication.   Past Medical History  Diagnosis Date  . Hydronephrosis, bilateral   . Right ureteral stone   . Bilateral kidney stones   . Frequency of urination   . Urgency of urination   . Hematuria   . Nocturia   . History of kidney stones      Home Meds: Outpatient Prescriptions Prior to Visit  Medication Sig Dispense Refill  .  fluticasone (FLONASE) 50 MCG/ACT nasal spray Place 2 sprays into both nostrils daily. 16 g 1  . ibuprofen (ADVIL,MOTRIN) 200 MG tablet Take 200 mg by mouth every 6 (six) hours as needed.    . Multiple Vitamin (MULTIVITAMIN) tablet 2 tabs po q 12 hours x 2 doses (Patient not taking: Reported on 08/30/2014) 4 tablet 5  . amoxicillin (AMOXIL) 500 MG capsule Take 1 capsule (500 mg total) by mouth 3 (three) times daily. 30 capsule 0   No facility-administered medications prior to visit.    Allergies: No Known Allergies  History   Social History  . Marital Status: Married    Spouse Name: N/A    Number of Children: N/A  . Years of Education: N/A   Occupational History  . Not on file.   Social History Main Topics  . Smoking status: Never Smoker   . Smokeless tobacco: Never Used  . Alcohol Use: No  . Drug Use: No  . Sexual Activity: Not on file   Other  Topics Concern  . Not on file   Social History Narrative    History reviewed. No pertinent family history.   Review of Systems:  See HPI for pertinent ROS. All other ROS negative.    Physical Exam: Blood pressure 112/94, pulse 84, temperature 98.1 F (36.7 C), temperature source Oral, resp. rate 18, height 5\' 7"  (1.702 m), weight 287 lb (130.182 kg)., Body mass index is 44.94 kg/(m^2). General: Overweight WM. Appears in no acute distress. Neck: Supple. No thyromegaly. No lymphadenopathy. Lungs: Clear bilaterally to auscultation without wheezes, rales, or rhonchi. Breathing is unlabored. Heart: RRR with S1 S2. No murmurs, rubs, or gallops. Musculoskeletal:  Strength and tone normal for age. Extremities/Skin: Warm and dry. Neuro: Alert and oriented X 3. Moves all extremities spontaneously. Gait is normal. CNII-XII grossly in tact. Psych:  Responds to questions appropriately with a normal affect.     ASSESSMENT AND PLAN:  38 y.o. year old male with  1. Generalized anxiety disorder - sertraline (ZOLOFT) 50 MG tablet; Take 1 tablet (50 mg total) by mouth daily.  Dispense: 30 tablet; Refill: 1 I discussed proper expectations of these medication with patient. Discussed that it will take weeks for it to start to take effect. Told him to call me immediately if he thinks it is causing adverse effects. Otherwise just continue to take it daily until he has his follow-up appointment with me which will be 6 weeks.  Signed, [address]Mary Beth HavanaDixon, [place], [facility] 08/30/2014 10:44 AM

## 2014-10-12 ENCOUNTER — Ambulatory Visit: Payer: BC Managed Care – PPO | Admitting: Physician Assistant

## 2014-10-18 ENCOUNTER — Ambulatory Visit (INDEPENDENT_AMBULATORY_CARE_PROVIDER_SITE_OTHER): Payer: BLUE CROSS/BLUE SHIELD | Admitting: Physician Assistant

## 2014-10-18 ENCOUNTER — Encounter: Payer: Self-pay | Admitting: Physician Assistant

## 2014-10-18 VITALS — BP 140/92 | HR 72 | Temp 98.1°F | Resp 18 | Wt 285.0 lb

## 2014-10-18 DIAGNOSIS — K219 Gastro-esophageal reflux disease without esophagitis: Secondary | ICD-10-CM

## 2014-10-18 DIAGNOSIS — F411 Generalized anxiety disorder: Secondary | ICD-10-CM

## 2014-10-18 MED ORDER — SERTRALINE HCL 100 MG PO TABS: 100.0000 mg | ORAL_TABLET | Freq: Every day | ORAL | Status: DC

## 2014-10-18 MED ORDER — OMEPRAZOLE 20 MG PO CPDR: 20.0000 mg | DELAYED_RELEASE_CAPSULE | Freq: Every day | ORAL | Status: AC

## 2014-10-18 NOTE — Progress Notes (Signed)
Patient ID: Lucas Contreras MRN: [id number], DOB: 05/11/1976, 39 y.o. Date of Encounter: @DATE @  Chief Complaint:  Chief Complaint  Patient presents with  . 6 week follow up    is fasting    HPI: 39 y.o. year old white male  presents for follow-up office visit to discuss anxiety/stress.    AT NEXT OV--DISCUSS TETANUS VACCINE---   THE FOLLOWING IS COPIED FROM HIS OV NOTE 08/30/2014:  He says that when he was here to see Dr. Jeanice Limurham in March for a sinus infection that he discussed this with her. I have reviewed her note today as well.  He works with a [employer] business and is on call 24 hours a day. He says that at work he gets upset with his coworkers and yells at them and goes off on them. Says after the fact he realizes that he should've handled the situation in a better way.  He says that his son is very active with sports. He says the baseball season is about to start up and he knows that he needs to get on som, treatment prior to that. Says that in the past he has gotten into altercations and has yelled and screamed at baseball games and has gotten into confrontations with referrees.  Again he says that after the fact he realizes that he was being inappropriate. Says that just when he gets into these situations, things come out without him unable to control it.  Says that he scheduled this visit because he is afraid that he is going to lose his job or end up with a divorce if he does not to do something.  Says that when he talked to Dr. Jeanice Limurham about this at the visit in March he was scared to take prescription medications. He went to [employer] and used an herbal medication but this did not work. He says that he is still very scared of taking any type of medications but definitely does not want to lose his wife or lose his job.  Says that he now realizes that he is also snapping on his wife and his son over very minor things. At work he says that he feels that his coworkers do not  take things seriously. Says that he has to have that job for financial income. Says that he gets upset because one of them and is older and is about to be retired so he feels that that worker does not have to take things seriously because he is not dependent on the income. Says that the other worker is "[place]".   Says that just yesterday he came home from work upset and as soon as his wife saw him she discussed the fact " he had had a bad day"  He says that his wife was going to come with him to the appointment today that she ended up having to go to the dentist herself.  Pt  says that he feels that all of this is stemming from feeling stressed and anxious all the time.  He says that he has gained 40 pounds over the past year and he is going to work on diet and exercise  But knows that he needs to get on this medication.  At the 08/30/2014 office visit, I prescribe Zoloft 50 mg once a day.  TODAY---10/18/2014---HE REPORTS: Today he reports that he definitely sees an improvement since being on the Zoloft. Says that both his wife and his son have made comments.  His wife has noticed that he does not get nearly as angry and upset about things as he did in the past. He says that his son has even told him that he notices that he smiles more now and has made comments of noticing changes as well. Patient says that at work he definitely has had decreased frequency of episodes of feeling angry and stressed and anxious. Also the severity of the symptoms are much reduced compared to what they were prior to the medication. Also says that he has noticed that his sleep is improved since being on this medication.  Says that for the first week or so he felt a little bit sick feeling but then those symptoms subsided. Says that he has had 3 episodes of GERD symptoms at night over the past couple of months.   Does not know whether this is secondary to the Zoloft or not-- but says that he was not having these  episodes prior to the past couple months. Says he usually does not eat within a couple hours of going to bed.   Past Medical History  Diagnosis Date  . Hydronephrosis, bilateral   . Right ureteral stone   . Bilateral kidney stones   . Frequency of urination   . Urgency of urination   . Hematuria   . Nocturia   . History of kidney stones      Home Meds: Outpatient Prescriptions Prior to Visit  Medication Sig Dispense Refill  . ibuprofen (ADVIL,MOTRIN) 200 MG tablet Take 200 mg by mouth every 6 (six) hours as needed.    . sertraline (ZOLOFT) 50 MG tablet Take 1 tablet (50 mg total) by mouth daily. 30 tablet 1  . fluticasone (FLONASE) 50 MCG/ACT nasal spray Place 2 sprays into both nostrils daily. (Patient not taking: Reported on 10/18/2014) 16 g 1  . Multiple Vitamin (MULTIVITAMIN) tablet 2 tabs po q 12 hours x 2 doses (Patient not taking: Reported on 08/30/2014) 4 tablet 5   No facility-administered medications prior to visit.    Allergies: No Known Allergies  History   Social History  . Marital Status: Married    Spouse Name: N/A    Number of Children: N/A  . Years of Education: N/A   Occupational History  . Not on file.   Social History Main Topics  . Smoking status: Never Smoker   . Smokeless tobacco: Never Used  . Alcohol Use: No  . Drug Use: No  . Sexual Activity: Not on file   Other Topics Concern  . Not on file   Social History Narrative    History reviewed. No pertinent family history.   Review of Systems:  See HPI for pertinent ROS. All other ROS negative.    Physical Exam: Blood pressure 140/92, pulse 72, temperature 98.1 F (36.7 C), temperature source Oral, resp. rate 18, weight 285 lb (129.275 kg)., Body mass index is 44.63 kg/(m^2). General: Overweight WM. Appears in no acute distress. Neck: Supple. No thyromegaly. No lymphadenopathy. Lungs: Clear bilaterally to auscultation without wheezes, rales, or rhonchi. Breathing is unlabored. Heart:  RRR with S1 S2. No murmurs, rubs, or gallops. Abdomen: Soft. No tenderness with palpation, even in epigastric region. No masses or organomegaly. Musculoskeletal:  Strength and tone normal for age. Extremities/Skin: Warm and dry. Neuro: Alert and oriented X 3. Moves all extremities spontaneously. Gait is normal. CNII-XII grossly in tact. Psych:  Responds to questions appropriately with a normal affect.     ASSESSMENT AND PLAN:  39 y.o. year old male with    1. Generalized anxiety disorder Will increase Zoloft to 100 mg.  Plan for follow-up office visit in 2 months.  Follow-up sooner if has any adverse effects from the medication or as if feeling worse in general. - sertraline (ZOLOFT) 100 MG tablet; Take 1 tablet (100 mg total) by mouth daily.  Dispense: 30 tablet; Refill: 3  2. Gastroesophageal reflux disease, esophagitis presence not specified Discussed the fact that he reported 20 pound weight gain at his last visit. Discussed that the GERD may be coming from this weight gain. Also discussed to make sure not to eat or drink for several hours prior to lying down to sleep. Will go ahead and send in prescription for omeprazole. Recommend that he take it daily for 1 week then decrease to  PRN. - omeprazole (PRILOSEC) 20 MG capsule; Take 1 capsule (20 mg total) by mouth daily.  Dispense: 30 capsule; Refill: 3   AT NEXT OV---DISCUSS TETANUS VACCINE  Signed, [address] [place], [place], [facility] 10/18/2014 8:54 AM

## 2014-12-25 ENCOUNTER — Ambulatory Visit: Payer: BLUE CROSS/BLUE SHIELD | Admitting: Physician Assistant

## 2015-01-31 ENCOUNTER — Other Ambulatory Visit: Payer: Self-pay | Admitting: Family Medicine

## 2015-02-26 ENCOUNTER — Other Ambulatory Visit: Payer: Self-pay | Admitting: Physician Assistant

## 2015-02-26 NOTE — Telephone Encounter (Signed)
Medication filled x1 with no refills.   Requires office visit before any further refills can be given.   Letter sent.  

## 2017-01-28 ENCOUNTER — Other Ambulatory Visit: Payer: Self-pay | Admitting: Physician Assistant

## 2019-05-10 ENCOUNTER — Other Ambulatory Visit: Payer: Self-pay

## 2019-05-10 DIAGNOSIS — Z20822 Contact with and (suspected) exposure to covid-19: Secondary | ICD-10-CM

## 2019-05-11 LAB — NOVEL CORONAVIRUS, NAA: SARS-CoV-2, NAA: NOT DETECTED

## 2019-07-28 ENCOUNTER — Other Ambulatory Visit: Payer: Self-pay

## 2019-07-28 DIAGNOSIS — Z20822 Contact with and (suspected) exposure to covid-19: Secondary | ICD-10-CM

## 2019-07-29 LAB — NOVEL CORONAVIRUS, NAA: SARS-CoV-2, NAA: NOT DETECTED

## 2019-09-17 DIAGNOSIS — Z20828 Contact with and (suspected) exposure to other viral communicable diseases: Secondary | ICD-10-CM | POA: Diagnosis not present

## 2019-09-17 DIAGNOSIS — J3489 Other specified disorders of nose and nasal sinuses: Secondary | ICD-10-CM | POA: Diagnosis not present

## 2019-09-19 DIAGNOSIS — Z03818 Encounter for observation for suspected exposure to other biological agents ruled out: Secondary | ICD-10-CM | POA: Diagnosis not present

## 2019-09-19 DIAGNOSIS — Z20828 Contact with and (suspected) exposure to other viral communicable diseases: Secondary | ICD-10-CM | POA: Diagnosis not present

## 2019-09-19 DIAGNOSIS — U071 COVID-19: Secondary | ICD-10-CM | POA: Diagnosis not present

## 2019-09-26 ENCOUNTER — Other Ambulatory Visit: Payer: Self-pay

## 2019-09-26 ENCOUNTER — Ambulatory Visit: Payer: BC Managed Care – PPO | Attending: Internal Medicine

## 2019-09-26 DIAGNOSIS — Z20822 Contact with and (suspected) exposure to covid-19: Secondary | ICD-10-CM

## 2019-09-28 LAB — NOVEL CORONAVIRUS, NAA: SARS-CoV-2, NAA: NOT DETECTED

## 2020-07-24 ENCOUNTER — Ambulatory Visit: Payer: BC Managed Care – PPO | Admitting: Family Medicine

## 2020-07-26 ENCOUNTER — Other Ambulatory Visit: Payer: Self-pay

## 2020-07-26 ENCOUNTER — Ambulatory Visit (INDEPENDENT_AMBULATORY_CARE_PROVIDER_SITE_OTHER): Payer: BC Managed Care – PPO | Admitting: Family Medicine

## 2020-07-26 VITALS — BP 140/92 | HR 89 | Temp 97.7°F | Ht 67.0 in | Wt 268.0 lb

## 2020-07-26 DIAGNOSIS — L209 Atopic dermatitis, unspecified: Secondary | ICD-10-CM | POA: Diagnosis not present

## 2020-07-26 DIAGNOSIS — E118 Type 2 diabetes mellitus with unspecified complications: Secondary | ICD-10-CM

## 2020-07-26 MED ORDER — TRIAMCINOLONE ACETONIDE 0.1 % EX CREA: 1.0000 "application " | TOPICAL_CREAM | Freq: Two times a day (BID) | CUTANEOUS | 1 refills | Status: DC

## 2020-07-26 NOTE — Progress Notes (Signed)
Subjective:    Patient ID: Lucas Contreras, male    DOB: 11-Oct-1975, 44 y.o.   MRN: [id number]  HPI  Patient is a 44 year old Caucasian gentleman here today to establish care with me.  He has previously been diagnosed with diabetes and was on Metformin.  He is not sure of the dose however he is only taking it once a day.  He believes he is on 1000 mg once a day.  He is not sure how his previous doctor wanted him to take it.  He is not on an ACE inhibitor or an angiotensin receptor blocker.  He states that he does not know if he has ever had his urine checked for microalbuminuria.  He is not on a statin.  He states that he tried Lipitor in the past but it made him feel fatigued.  His primary reason to come today was to have a rash evaluated on his anterior shins.  On both anterior shins although the right is worse than the left, he has an erythematous scaly rash.  The rash is irregularly shaped.  There are plaques with irregular borders.  There is a papulosquamous eruption within the plaque.  Each of the plaques are approximately 1.5 to 2 cm in diameter.  There are 3 separate plaques on the anterior surface of the right leg and 1 separate plaque on the anterior surface of the left leg.  It does not appear to be psoriasis but I am suspicious of nummular eczema Past Medical History:  Diagnosis Date  . Bilateral kidney stones   . Frequency of urination   . Hematuria   . History of kidney stones   . Hydronephrosis, bilateral   . Nocturia   . Right ureteral stone   . Urgency of urination    Past Surgical History:  Procedure Laterality Date  . BICEPS TENDON REPAIR  FEB 2012  . REMOVAL CYST, LEFT LOWER LEG  2005  . URETEROSCOPY  01/20/2012   Procedure: URETEROSCOPY;  Surgeon: Antony Haste, MD;  Location: [facility];  Service: Urology;  Laterality: N/A;  BILATERAL URETEROSCOPY, LASER LITHO AND RIGHT URETERAL STENTS  LASER CARM LASER AND RAD TECH OK PER MAIN    Current  Outpatient Medications on File Prior to Visit  Medication Sig Dispense Refill  . ibuprofen (ADVIL,MOTRIN) 200 MG tablet Take 200 mg by mouth every 6 (six) hours as needed.    . metFORMIN (GLUCOPHAGE) 1000 MG tablet Take 1,000 mg by mouth daily with breakfast.    . omeprazole (PRILOSEC) 20 MG capsule Take 1 capsule (20 mg total) by mouth daily. 30 capsule 3  . sertraline (ZOLOFT) 100 MG tablet TAKE 1 TABLET (100 MG TOTAL) BY MOUTH DAILY. 30 tablet 0  . valACYclovir (VALTREX) 1000 MG tablet TAKE 2 TABLETS BY MOUTH TWICE A DAY 4 tablet 0  . fluticasone (FLONASE) 50 MCG/ACT nasal spray Place 2 sprays into both nostrils daily. (Patient not taking: Reported on 10/18/2014) 16 g 1  . Multiple Vitamin (MULTIVITAMIN) tablet 2 tabs po q 12 hours x 2 doses (Patient not taking: Reported on 08/30/2014) 4 tablet 5   No current facility-administered medications on file prior to visit.   No Known Allergies Social History   Socioeconomic History  . Marital status: Married    Spouse name: Not on file  . Number of children: Not on file  . Years of education: Not on file  . Highest education level: Not on file  Occupational History  .  Not on file  Tobacco Use  . Smoking status: Never Smoker  . Smokeless tobacco: Never Used  Substance and Sexual Activity  . Alcohol use: No  . Drug use: No  . Sexual activity: Not on file  Other Topics Concern  . Not on file  Social History Narrative  . Not on file   Social Determinants of Health   Financial Resource Strain:   . Difficulty of Paying Living Expenses: Not on file  Food Insecurity:   . Worried About Programme researcher, broadcasting/film/video in the Last Year: Not on file  . Ran Out of Food in the Last Year: Not on file  Transportation Needs:   . Lack of Transportation (Medical): Not on file  . Lack of Transportation (Non-Medical): Not on file  Physical Activity:   . Days of Exercise per Week: Not on file  . Minutes of Exercise per Session: Not on file  Stress:   .  Feeling of Stress : Not on file  Social Connections:   . Frequency of Communication with Friends and Family: Not on file  . Frequency of Social Gatherings with Friends and Family: Not on file  . Attends Religious Services: Not on file  . Active Member of Clubs or Organizations: Not on file  . Attends Banker Meetings: Not on file  . Marital Status: Not on file  Intimate Partner Violence:   . Fear of Current or Ex-Partner: Not on file  . Emotionally Abused: Not on file  . Physically Abused: Not on file  . Sexually Abused: Not on file     Review of Systems  All other systems reviewed and are negative.      Objective:   Physical Exam Vitals reviewed.  Constitutional:      Appearance: Normal appearance. He is obese.  Cardiovascular:     Rate and Rhythm: Normal rate and regular rhythm.     Pulses: Normal pulses.     Heart sounds: Normal heart sounds.  Pulmonary:     Effort: Pulmonary effort is normal. No respiratory distress.     Breath sounds: Normal breath sounds. No wheezing, rhonchi or rales.  Abdominal:     General: Abdomen is flat. Bowel sounds are normal.     Palpations: Abdomen is soft.  Musculoskeletal:     Right lower leg: No edema.     Left lower leg: No edema.  Skin:    Findings: Rash present. Rash is macular, papular and scaling. Rash is not nodular, purpuric, pustular, urticarial or vesicular.       Neurological:     Mental Status: He is alert.           Assessment & Plan:  Controlled type 2 diabetes mellitus with complication, without long-term current use of insulin (HCC) - Plan: CBC with Differential/Platelet, COMPLETE METABOLIC PANEL WITH GFR, Hemoglobin A1c, Microalbumin, urine  Atopic dermatitis, unspecified type  Regarding his diabetes I will check an A1c today.  Goal A1c is less than 6.5.  Will likely need to encourage the patient to take this medication twice a day.  Also recommended an aspirin 81 mg daily.  If his A1c is greater  than 6.5 I would recommend trying the patient on a different statin such as Crestor 10 mg a day.  I will also check a urine microalbumin and if elevated would recommend an ACE inhibitor.  Treat atopic dermatitis with triamcinolone cream twice daily for 7 to 14 days and recheck if no better after  14 days.  Recommended a flu shot but he declined.  Patient has already had his Covid vaccine.

## 2020-07-27 ENCOUNTER — Other Ambulatory Visit: Payer: Self-pay

## 2020-07-27 DIAGNOSIS — E1159 Type 2 diabetes mellitus with other circulatory complications: Secondary | ICD-10-CM

## 2020-07-27 DIAGNOSIS — E118 Type 2 diabetes mellitus with unspecified complications: Secondary | ICD-10-CM

## 2020-07-27 DIAGNOSIS — I152 Hypertension secondary to endocrine disorders: Secondary | ICD-10-CM

## 2020-07-27 LAB — CBC WITH DIFFERENTIAL/PLATELET
Absolute Monocytes: 410 cells/uL (ref 200–950)
Basophils Absolute: 40 cells/uL (ref 0–200)
Basophils Relative: 0.7 %
Eosinophils Absolute: 257 cells/uL (ref 15–500)
Eosinophils Relative: 4.5 %
HCT: 46.2 % (ref 38.5–50.0)
Hemoglobin: 15 g/dL (ref 13.2–17.1)
Lymphs Abs: 2314 cells/uL (ref 850–3900)
MCH: 28 pg (ref 27.0–33.0)
MCHC: 32.5 g/dL (ref 32.0–36.0)
MCV: 86.2 fL (ref 80.0–100.0)
MPV: 10.2 fL (ref 7.5–12.5)
Monocytes Relative: 7.2 %
Neutro Abs: 2679 cells/uL (ref 1500–7800)
Neutrophils Relative %: 47 %
Platelets: 270 10*3/uL (ref 140–400)
RBC: 5.36 10*6/uL (ref 4.20–5.80)
RDW: 12.7 % (ref 11.0–15.0)
Total Lymphocyte: 40.6 %
WBC: 5.7 10*3/uL (ref 3.8–10.8)

## 2020-07-27 LAB — HEMOGLOBIN A1C
Hgb A1c MFr Bld: 10.8 % of total Hgb — ABNORMAL HIGH (ref <5.7)
Mean Plasma Glucose: 263 (calc)
eAG (mmol/L): 14.6 (calc)

## 2020-07-27 LAB — COMPLETE METABOLIC PANEL WITH GFR
AG Ratio: 2 (calc) (ref 1.0–2.5)
ALT: 48 U/L — ABNORMAL HIGH (ref 9–46)
AST: 29 U/L (ref 10–40)
Albumin: 4.5 g/dL (ref 3.6–5.1)
Alkaline phosphatase (APISO): 81 U/L (ref 36–130)
BUN: 18 mg/dL (ref 7–25)
CO2: 28 mmol/L (ref 20–32)
Calcium: 9.4 mg/dL (ref 8.6–10.3)
Chloride: 96 mmol/L — ABNORMAL LOW (ref 98–110)
Creat: 0.85 mg/dL (ref 0.60–1.35)
GFR, Est African American: 123 mL/min/{1.73_m2} (ref >=60)
GFR, Est Non African American: 106 mL/min/{1.73_m2} (ref >=60)
Globulin: 2.3 g/dL (calc) (ref 1.9–3.7)
Glucose, Bld: 308 mg/dL — ABNORMAL HIGH (ref 65–99)
Potassium: 4.7 mmol/L (ref 3.5–5.3)
Sodium: 136 mmol/L (ref 135–146)
Total Bilirubin: 0.6 mg/dL (ref 0.2–1.2)
Total Protein: 6.8 g/dL (ref 6.1–8.1)

## 2020-07-27 LAB — MICROALBUMIN, URINE: Microalb, Ur: 4.5 mg/dL

## 2020-07-27 MED ORDER — LISINOPRIL 10 MG PO TABS: 10.0000 mg | ORAL_TABLET | Freq: Every day | ORAL | 0 refills | Status: DC

## 2020-07-27 MED ORDER — METFORMIN HCL 1000 MG PO TABS: 1000.0000 mg | ORAL_TABLET | Freq: Two times a day (BID) | ORAL | 0 refills | Status: DC

## 2020-07-27 MED ORDER — GLIPIZIDE ER 10 MG PO TB24: 10.0000 mg | ORAL_TABLET | Freq: Every day | ORAL | 0 refills | Status: DC

## 2020-08-22 ENCOUNTER — Other Ambulatory Visit: Payer: Self-pay | Admitting: Family Medicine

## 2020-08-22 DIAGNOSIS — I152 Hypertension secondary to endocrine disorders: Secondary | ICD-10-CM

## 2020-08-22 DIAGNOSIS — E118 Type 2 diabetes mellitus with unspecified complications: Secondary | ICD-10-CM

## 2020-09-05 ENCOUNTER — Other Ambulatory Visit: Payer: Self-pay | Admitting: Family Medicine

## 2020-09-05 DIAGNOSIS — E118 Type 2 diabetes mellitus with unspecified complications: Secondary | ICD-10-CM

## 2020-09-05 DIAGNOSIS — E1159 Type 2 diabetes mellitus with other circulatory complications: Secondary | ICD-10-CM

## 2020-10-10 DIAGNOSIS — L308 Other specified dermatitis: Secondary | ICD-10-CM | POA: Diagnosis not present

## 2020-10-10 DIAGNOSIS — D225 Melanocytic nevi of trunk: Secondary | ICD-10-CM | POA: Diagnosis not present

## 2020-12-10 ENCOUNTER — Telehealth: Payer: Self-pay

## 2020-12-10 NOTE — Telephone Encounter (Signed)
Ok, definitely want to check his bp here.

## 2020-12-11 ENCOUNTER — Other Ambulatory Visit: Payer: Self-pay

## 2020-12-11 ENCOUNTER — Ambulatory Visit (INDEPENDENT_AMBULATORY_CARE_PROVIDER_SITE_OTHER): Payer: BC Managed Care – PPO | Admitting: Family Medicine

## 2020-12-11 ENCOUNTER — Encounter: Payer: Self-pay | Admitting: Family Medicine

## 2020-12-11 VITALS — BP 154/90 | HR 112 | Temp 98.5°F | Resp 14 | Ht 67.0 in | Wt 267.0 lb

## 2020-12-11 DIAGNOSIS — E1165 Type 2 diabetes mellitus with hyperglycemia: Secondary | ICD-10-CM

## 2020-12-11 DIAGNOSIS — I10 Essential (primary) hypertension: Secondary | ICD-10-CM

## 2020-12-11 NOTE — Progress Notes (Signed)
Subjective:    Patient ID: Lucas Contreras, male    DOB: 1975-12-22, 45 y.o.   MRN: [id number]  HPI  Patient is a very pleasant 45 year old Caucasian male who is here today because of his blood pressure.  I first met the patient in November.  At that time his hemoglobin A1c was greater than 10!  Patient has not been seen since that visit.  He admits that he is not been eating well and has not been consistent in taking his medication.  He did not start lisinopril.  Yesterday he went for dental work.  He was already nervous about having the dental work done.  He also had a stressful situation at work.  He also admits that he has some whitecoat syndrome.  As a result when he went to the dentist his blood pressure was greater than 160/100.  They would not perform the procedure and recommended that he follow-up with me today.  I rechecked his blood pressure personally and found it to be 160/98.  He denies any chest pain or shortness of breath or dyspnea on exertion. Past Medical History:  Diagnosis Date  . Bilateral kidney stones   . Frequency of urination   . Hematuria   . History of kidney stones   . Hydronephrosis, bilateral   . Nocturia   . Right ureteral stone   . Urgency of urination    Past Surgical History:  Procedure Laterality Date  . BICEPS TENDON REPAIR  FEB 2012  . REMOVAL CYST, LEFT LOWER LEG  2005  . URETEROSCOPY  01/20/2012   Procedure: URETEROSCOPY;  Surgeon: Fredricka Bonine, MD;  Location: [facility];  Service: Urology;  Laterality: N/A;  BILATERAL URETEROSCOPY, LASER LITHO AND RIGHT URETERAL STENTS  LASER CARM LASER AND RAD TECH OK PER MAIN    Current Outpatient Medications on File Prior to Visit  Medication Sig Dispense Refill  . ibuprofen (ADVIL,MOTRIN) 200 MG tablet Take 200 mg by mouth every 6 (six) hours as needed.    Marland Kitchen lisinopril (ZESTRIL) 10 MG tablet TAKE 1 TABLET BY MOUTH EVERY DAY 90 tablet 1  . metFORMIN (GLUCOPHAGE) 1000 MG tablet Take  1 tablet (1,000 mg total) by mouth 2 (two) times daily with a meal. 120 tablet 0  . omeprazole (PRILOSEC) 20 MG capsule Take 1 capsule (20 mg total) by mouth daily. 30 capsule 3  . sertraline (ZOLOFT) 100 MG tablet TAKE 1 TABLET (100 MG TOTAL) BY MOUTH DAILY. 30 tablet 0  . triamcinolone cream (KENALOG) 0.1 % Apply 1 application topically 2 (two) times daily. 30 g 1  . valACYclovir (VALTREX) 1000 MG tablet TAKE 2 TABLETS BY MOUTH TWICE A DAY 4 tablet 0   No current facility-administered medications on file prior to visit.   No Known Allergies Social History   Socioeconomic History  . Marital status: Married    Spouse name: Not on file  . Number of children: Not on file  . Years of education: Not on file  . Highest education level: Not on file  Occupational History  . Not on file  Tobacco Use  . Smoking status: Never Smoker  . Smokeless tobacco: Never Used  Substance and Sexual Activity  . Alcohol use: No  . Drug use: No  . Sexual activity: Not on file  Other Topics Concern  . Not on file  Social History Narrative  . Not on file   Social Determinants of Health   Financial Resource Strain: Not  on file  Food Insecurity: Not on file  Transportation Needs: Not on file  Physical Activity: Not on file  Stress: Not on file  Social Connections: Not on file  Intimate Partner Violence: Not on file     Review of Systems  All other systems reviewed and are negative.      Objective:   Physical Exam Vitals reviewed.  Constitutional:      Appearance: Normal appearance. He is obese.  Cardiovascular:     Rate and Rhythm: Normal rate and regular rhythm.     Pulses: Normal pulses.     Heart sounds: Normal heart sounds.  Pulmonary:     Effort: Pulmonary effort is normal. No respiratory distress.     Breath sounds: Normal breath sounds. No wheezing, rhonchi or rales.  Abdominal:     General: Abdomen is flat. Bowel sounds are normal.     Palpations: Abdomen is soft.   Musculoskeletal:     Right lower leg: No edema.     Left lower leg: No edema.  Skin:    Findings: Rash is not nodular, purpuric, pustular, urticarial or vesicular.  Neurological:     Mental Status: He is alert.           Assessment & Plan:  Uncontrolled type 2 diabetes mellitus with hyperglycemia ([place]) - Plan: COMPLETE METABOLIC PANEL WITH GFR, Hemoglobin A1c  Benign essential HTN  It certainly possible that his blood pressure yesterday could have been due to whitecoat syndrome coupled with stress coupled with anxiety.  However I cannot in good conscience ignore this blood pressure.  Therefore I have asked the patient to get a blood pressure cuff and start checking his blood pressure twice a day at home.  If his blood pressure is less than 140/90, he does not require any medication.  If his blood pressures between 140 and 150/80-90 I would recommend 10 mg of lisinopril as I prescribed to him in November.  However if his systolic blood pressures greater than 546 or his diastolic blood pressure is higher than 95, I would recommend increasing his lisinopril to 40 mg a day and then rechecking blood pressure in 1 week.  I will also check his A1c today.  A1c goal is less than 6.5.  Strongly encourage the patient to be compliant with his medication due to the long-term potential side effects of uncontrolled diabetes and hypertension

## 2020-12-12 ENCOUNTER — Other Ambulatory Visit: Payer: Self-pay | Admitting: *Deleted

## 2020-12-12 LAB — COMPLETE METABOLIC PANEL WITH GFR
AG Ratio: 1.7 (calc) (ref 1.0–2.5)
ALT: 41 U/L (ref 9–46)
AST: 22 U/L (ref 10–40)
Albumin: 4.5 g/dL (ref 3.6–5.1)
Alkaline phosphatase (APISO): 90 U/L (ref 36–130)
BUN: 16 mg/dL (ref 7–25)
CO2: 30 mmol/L (ref 20–32)
Calcium: 9.2 mg/dL (ref 8.6–10.3)
Chloride: 100 mmol/L (ref 98–110)
Creat: 0.92 mg/dL (ref 0.60–1.35)
GFR, Est African American: 116 mL/min/{1.73_m2} (ref >=60)
GFR, Est Non African American: 100 mL/min/{1.73_m2} (ref >=60)
Globulin: 2.6 g/dL (calc) (ref 1.9–3.7)
Glucose, Bld: 276 mg/dL — ABNORMAL HIGH (ref 65–99)
Potassium: 4.7 mmol/L (ref 3.5–5.3)
Sodium: 139 mmol/L (ref 135–146)
Total Bilirubin: 0.4 mg/dL (ref 0.2–1.2)
Total Protein: 7.1 g/dL (ref 6.1–8.1)

## 2020-12-12 LAB — HEMOGLOBIN A1C
Hgb A1c MFr Bld: 10.7 % of total Hgb — ABNORMAL HIGH (ref <5.7)
Mean Plasma Glucose: 260 mg/dL
eAG (mmol/L): 14.4 mmol/L

## 2020-12-12 MED ORDER — PIOGLITAZONE HCL 30 MG PO TABS: 30.0000 mg | ORAL_TABLET | Freq: Every day | ORAL | 0 refills | Status: DC

## 2020-12-12 MED ORDER — GLIPIZIDE ER 10 MG PO TB24: 10.0000 mg | ORAL_TABLET | Freq: Every day | ORAL | 0 refills | Status: DC

## 2021-07-01 ENCOUNTER — Ambulatory Visit: Payer: BC Managed Care – PPO | Admitting: Family Medicine

## 2021-10-28 DIAGNOSIS — L280 Lichen simplex chronicus: Secondary | ICD-10-CM | POA: Diagnosis not present

## 2021-10-28 DIAGNOSIS — L570 Actinic keratosis: Secondary | ICD-10-CM | POA: Diagnosis not present

## 2021-12-05 DIAGNOSIS — Z6841 Body Mass Index (BMI) 40.0 and over, adult: Secondary | ICD-10-CM | POA: Diagnosis not present

## 2021-12-05 DIAGNOSIS — Z789 Other specified health status: Secondary | ICD-10-CM | POA: Diagnosis not present

## 2021-12-05 DIAGNOSIS — F419 Anxiety disorder, unspecified: Secondary | ICD-10-CM | POA: Diagnosis not present

## 2021-12-05 DIAGNOSIS — Z2821 Immunization not carried out because of patient refusal: Secondary | ICD-10-CM | POA: Diagnosis not present

## 2021-12-05 DIAGNOSIS — E1165 Type 2 diabetes mellitus with hyperglycemia: Secondary | ICD-10-CM | POA: Diagnosis not present

## 2021-12-05 DIAGNOSIS — Z299 Encounter for prophylactic measures, unspecified: Secondary | ICD-10-CM | POA: Diagnosis not present

## 2021-12-05 DIAGNOSIS — F32A Depression, unspecified: Secondary | ICD-10-CM | POA: Diagnosis not present

## 2021-12-05 DIAGNOSIS — E1129 Type 2 diabetes mellitus with other diabetic kidney complication: Secondary | ICD-10-CM | POA: Diagnosis not present

## 2022-01-16 DIAGNOSIS — Z0000 Encounter for general adult medical examination without abnormal findings: Secondary | ICD-10-CM | POA: Diagnosis not present

## 2022-01-16 DIAGNOSIS — Z789 Other specified health status: Secondary | ICD-10-CM | POA: Diagnosis not present

## 2022-01-16 DIAGNOSIS — F419 Anxiety disorder, unspecified: Secondary | ICD-10-CM | POA: Diagnosis not present

## 2022-01-16 DIAGNOSIS — I10 Essential (primary) hypertension: Secondary | ICD-10-CM | POA: Diagnosis not present

## 2022-01-16 DIAGNOSIS — Z299 Encounter for prophylactic measures, unspecified: Secondary | ICD-10-CM | POA: Diagnosis not present

## 2022-01-16 DIAGNOSIS — Z79899 Other long term (current) drug therapy: Secondary | ICD-10-CM | POA: Diagnosis not present

## 2022-01-16 DIAGNOSIS — Z6841 Body Mass Index (BMI) 40.0 and over, adult: Secondary | ICD-10-CM | POA: Diagnosis not present

## 2022-01-16 DIAGNOSIS — Z1331 Encounter for screening for depression: Secondary | ICD-10-CM | POA: Diagnosis not present

## 2022-01-16 DIAGNOSIS — E7800 Pure hypercholesterolemia, unspecified: Secondary | ICD-10-CM | POA: Diagnosis not present

## 2022-01-16 DIAGNOSIS — E1165 Type 2 diabetes mellitus with hyperglycemia: Secondary | ICD-10-CM | POA: Diagnosis not present

## 2022-01-22 ENCOUNTER — Emergency Department (HOSPITAL_COMMUNITY)

## 2022-01-22 ENCOUNTER — Emergency Department (HOSPITAL_COMMUNITY)
Admission: EM | Admit: 2022-01-22 | Discharge: 2022-01-22 | Disposition: A | Discharge status: Home / Self Care | Attending: Emergency Medicine | Admitting: Emergency Medicine

## 2022-01-22 ENCOUNTER — Other Ambulatory Visit: Payer: Self-pay

## 2022-01-22 ENCOUNTER — Encounter (HOSPITAL_COMMUNITY): Payer: Self-pay | Admitting: Emergency Medicine

## 2022-01-22 DIAGNOSIS — S0990XA Unspecified injury of head, initial encounter: Secondary | ICD-10-CM | POA: Diagnosis present

## 2022-01-22 DIAGNOSIS — Z7984 Long term (current) use of oral hypoglycemic drugs: Secondary | ICD-10-CM | POA: Insufficient documentation

## 2022-01-22 DIAGNOSIS — Z79899 Other long term (current) drug therapy: Secondary | ICD-10-CM | POA: Insufficient documentation

## 2022-01-22 DIAGNOSIS — S060XAA Concussion with loss of consciousness status unknown, initial encounter: Secondary | ICD-10-CM | POA: Insufficient documentation

## 2022-01-22 DIAGNOSIS — Y990 Civilian activity done for income or pay: Secondary | ICD-10-CM | POA: Diagnosis not present

## 2022-01-22 DIAGNOSIS — Y92410 Unspecified street and highway as the place of occurrence of the external cause: Secondary | ICD-10-CM | POA: Diagnosis not present

## 2022-01-22 DIAGNOSIS — I10 Essential (primary) hypertension: Secondary | ICD-10-CM | POA: Diagnosis not present

## 2022-01-22 DIAGNOSIS — E119 Type 2 diabetes mellitus without complications: Secondary | ICD-10-CM | POA: Diagnosis not present
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
## 2022-01-22 NOTE — Discharge Instructions (Addendum)
If you develop new or worsening headache, confusion, vision changes, vomiting, inability to walk, weakness or numbness, or any other new/concerning symptoms then return to the ER for evaluation. ? ?There was no bleeding or swelling on your CT scan today.  However they did note a small area that could be consistent with an old stroke.  You need to follow-up with your primary care physician for further outpatient work-up as needed. ?
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
## 2022-02-13 DIAGNOSIS — E1165 Type 2 diabetes mellitus with hyperglycemia: Secondary | ICD-10-CM | POA: Diagnosis not present

## 2022-02-13 DIAGNOSIS — Z6841 Body Mass Index (BMI) 40.0 and over, adult: Secondary | ICD-10-CM | POA: Diagnosis not present

## 2022-02-13 DIAGNOSIS — I10 Essential (primary) hypertension: Secondary | ICD-10-CM | POA: Diagnosis not present

## 2022-02-13 DIAGNOSIS — Z299 Encounter for prophylactic measures, unspecified: Secondary | ICD-10-CM | POA: Diagnosis not present

## 2022-02-13 DIAGNOSIS — M26609 Unspecified temporomandibular joint disorder, unspecified side: Secondary | ICD-10-CM | POA: Diagnosis not present

## 2022-02-13 DIAGNOSIS — F32A Depression, unspecified: Secondary | ICD-10-CM | POA: Diagnosis not present

## 2022-02-13 DIAGNOSIS — F419 Anxiety disorder, unspecified: Secondary | ICD-10-CM | POA: Diagnosis not present

## 2022-02-18 DIAGNOSIS — Z6841 Body Mass Index (BMI) 40.0 and over, adult: Secondary | ICD-10-CM | POA: Diagnosis not present

## 2022-02-18 DIAGNOSIS — Z713 Dietary counseling and surveillance: Secondary | ICD-10-CM | POA: Diagnosis not present

## 2022-02-18 DIAGNOSIS — I10 Essential (primary) hypertension: Secondary | ICD-10-CM | POA: Diagnosis not present

## 2022-02-18 DIAGNOSIS — H6123 Impacted cerumen, bilateral: Secondary | ICD-10-CM | POA: Diagnosis not present

## 2022-02-18 DIAGNOSIS — Z299 Encounter for prophylactic measures, unspecified: Secondary | ICD-10-CM | POA: Diagnosis not present

## 2022-02-25 DIAGNOSIS — Z6841 Body Mass Index (BMI) 40.0 and over, adult: Secondary | ICD-10-CM | POA: Diagnosis not present

## 2022-02-25 DIAGNOSIS — H6123 Impacted cerumen, bilateral: Secondary | ICD-10-CM | POA: Diagnosis not present

## 2022-02-25 DIAGNOSIS — I10 Essential (primary) hypertension: Secondary | ICD-10-CM | POA: Diagnosis not present

## 2022-02-25 DIAGNOSIS — Z299 Encounter for prophylactic measures, unspecified: Secondary | ICD-10-CM | POA: Diagnosis not present

## 2022-05-02 DIAGNOSIS — S60419A Abrasion of unspecified finger, initial encounter: Secondary | ICD-10-CM | POA: Diagnosis not present

## 2022-05-02 DIAGNOSIS — S6992XA Unspecified injury of left wrist, hand and finger(s), initial encounter: Secondary | ICD-10-CM | POA: Diagnosis not present

## 2022-05-02 DIAGNOSIS — S62397A Other fracture of fifth metacarpal bone, left hand, initial encounter for closed fracture: Secondary | ICD-10-CM | POA: Diagnosis not present

## 2022-10-28 DIAGNOSIS — L570 Actinic keratosis: Secondary | ICD-10-CM | POA: Diagnosis not present

## 2022-10-28 DIAGNOSIS — L281 Prurigo nodularis: Secondary | ICD-10-CM | POA: Diagnosis not present

## 2022-11-12 DIAGNOSIS — R809 Proteinuria, unspecified: Secondary | ICD-10-CM | POA: Diagnosis not present

## 2022-11-12 DIAGNOSIS — U071 COVID-19: Secondary | ICD-10-CM | POA: Diagnosis not present

## 2022-11-12 DIAGNOSIS — E1129 Type 2 diabetes mellitus with other diabetic kidney complication: Secondary | ICD-10-CM | POA: Diagnosis not present

## 2022-11-12 DIAGNOSIS — E1165 Type 2 diabetes mellitus with hyperglycemia: Secondary | ICD-10-CM | POA: Diagnosis not present
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
## 2023-12-29 ENCOUNTER — Ambulatory Visit: Payer: Self-pay | Admitting: Family Medicine

## 2023-12-29 ENCOUNTER — Encounter: Payer: Self-pay | Admitting: Family Medicine

## 2023-12-29 VITALS — BP 138/98 | HR 97 | Temp 97.6°F | Ht 66.75 in | Wt 255.0 lb

## 2023-12-29 DIAGNOSIS — Z13220 Encounter for screening for lipoid disorders: Secondary | ICD-10-CM

## 2023-12-29 DIAGNOSIS — Z114 Encounter for screening for human immunodeficiency virus [HIV]: Secondary | ICD-10-CM | POA: Diagnosis not present

## 2023-12-29 DIAGNOSIS — E118 Type 2 diabetes mellitus with unspecified complications: Secondary | ICD-10-CM | POA: Diagnosis not present

## 2023-12-29 DIAGNOSIS — K219 Gastro-esophageal reflux disease without esophagitis: Secondary | ICD-10-CM

## 2023-12-29 DIAGNOSIS — I152 Hypertension secondary to endocrine disorders: Secondary | ICD-10-CM

## 2023-12-29 DIAGNOSIS — Z0001 Encounter for general adult medical examination with abnormal findings: Secondary | ICD-10-CM | POA: Diagnosis not present

## 2023-12-29 DIAGNOSIS — Z1159 Encounter for screening for other viral diseases: Secondary | ICD-10-CM

## 2023-12-29 DIAGNOSIS — Z0000 Encounter for general adult medical examination without abnormal findings: Secondary | ICD-10-CM | POA: Insufficient documentation

## 2023-12-29 DIAGNOSIS — Z23 Encounter for immunization: Secondary | ICD-10-CM

## 2023-12-29 DIAGNOSIS — E1159 Type 2 diabetes mellitus with other circulatory complications: Secondary | ICD-10-CM

## 2023-12-29 DIAGNOSIS — Z79899 Other long term (current) drug therapy: Secondary | ICD-10-CM

## 2023-12-29 DIAGNOSIS — Z1211 Encounter for screening for malignant neoplasm of colon: Secondary | ICD-10-CM

## 2023-12-29 DIAGNOSIS — F411 Generalized anxiety disorder: Secondary | ICD-10-CM

## 2023-12-29 DIAGNOSIS — E1165 Type 2 diabetes mellitus with hyperglycemia: Secondary | ICD-10-CM | POA: Insufficient documentation

## 2023-12-29 MED ORDER — BLOOD GLUCOSE MONITORING SUPPL DEVI: 1.0000 | Freq: Three times a day (TID) | 0 refills | Status: AC

## 2023-12-29 MED ORDER — LANCETS MISC. MISC: 1.0000 | Freq: Three times a day (TID) | 0 refills | Status: AC

## 2023-12-29 MED ORDER — LANCET DEVICE MISC: 1.0000 | Freq: Three times a day (TID) | 0 refills | Status: AC

## 2023-12-29 MED ORDER — BLOOD GLUCOSE TEST VI STRP: 1.0000 | ORAL_STRIP | Freq: Three times a day (TID) | 0 refills | Status: AC

## 2023-12-29 MED ORDER — SERTRALINE HCL 50 MG PO TABS: 50.0000 mg | ORAL_TABLET | Freq: Every day | ORAL | 1 refills | Status: DC

## 2023-12-29 NOTE — Assessment & Plan Note (Signed)
 Well controlled on Prilosec. Elevated HOB if needed and avoid lying down 2-3 hours after eating, avoid coffee, alcohol, chocolate, fatty foods, citrus, carbonated beverages, spicy foods, late meals, and smoking. Return to office if symptoms return or worsen and seek medical care for difficulty swallowing, bleeding, anemia, weight loss, recurrent vomiting

## 2023-12-29 NOTE — Assessment & Plan Note (Signed)

## 2023-12-29 NOTE — Assessment & Plan Note (Signed)
 Counseled on importance of weight management for overall health. Encouraged low calorie, heart healthy diet and moderate intensity exercise 150 minutes weekly. This is 3-5 times weekly for 30-50 minutes each session. Goal should be pace of 3 miles/hours, or walking 1.5 miles in 30 minutes and include strength training. Discussed risks of obesity.

## 2023-12-29 NOTE — Assessment & Plan Note (Signed)
 Last A1c >7. Continue Metformin 1000mg  BID. A1c and uACR today. Foot exam due. Vaccines tdap and pna today. Retinal eye exam due. Recommend heart healthy diet such as Mediterranean diet with whole grains, fruits, vegetable, fish, lean meats, nuts, and olive oil. Limit salt. Encouraged moderate walking, 3-5 times/week for 30-50 minutes each session. Aim for at least 150 minutes.week. Goal should be pace of 3 miles/hours, or walking 1.5 miles in 30 minutes. Seek medical care for urinary frequency, extreme thirst, vision changes, lightheadedness, dizziness.  Non-fasting labs today. Follow up in 1-2 weeks.

## 2023-12-29 NOTE — Assessment & Plan Note (Signed)
 Not at goal <130/80. Will monitor at home BID and return to office in 1-2 weeks. Non-fasting labs today. Recommend heart healthy diet such as Mediterranean diet with whole grains, fruits, vegetable, fish, lean meats, nuts, and olive oil. Limit salt. Encouraged moderate walking, 3-5 times/week for 30-50 minutes each session. Aim for at least 150 minutes.week. Goal should be pace of 3 miles/hours, or walking 1.5 miles in 30 minutes. Avoid tobacco products. Avoid excess alcohol. Take medications as prescribed and bring medications and blood pressure log with cuff to each office visit. Seek medical care for chest pain, palpitations, shortness of breath with exertion, dizziness/lightheadedness, vision changes, recurrent headaches, or swelling of extremities. Follow up in 1-2 weeks.

## 2023-12-29 NOTE — Assessment & Plan Note (Signed)
 Would like to wean off Zoloft. Decrease to 75mg  daily for 2 weeks then 50mg  daily if tolerating. GAD 0. Symptoms stable for many years.

## 2023-12-29 NOTE — Progress Notes (Signed)
 New Patient Office Visit  Subjective    Patient ID: Lucas Contreras, male    DOB: 12-20-1975  Age: 48 y.o. MRN: [id number]  CC:  Chief Complaint  Patient presents with   Establish Care    Check up  Random chest pains that spread across back w/ jaw pain.  Order colonoscopy    HPI [place] D Lezotte presents to establish care. Oriented to practice routines and expectations. PMH includes DM2, HTN, anxiety. Concerns include "nagging pain" in his chest on the right side for 5-10 minutes 2 days ago. This was an isolated event. Does not recall what he was doing, maybe in garage working on his truck. Denies heavy lifting, new exercise, shortness of breath, palpitations, vision changes, diaphoresis.  Mr Rase does exercise 3-5 days per week cardio and strength. He does eat a regular diet.   Colon CA screening: cologuard ordered Tobacco: non-smoker STI: declines Vaccines:  tdap today , declines pneumonia  DIABETES Hypoglycemic episodes:no Polydipsia/polyuria: no Visual disturbance: no Chest pain: yes Paresthesias: no Glucose Monitoring: no  Accucheck frequency: Not Checking  Fasting glucose:  Post prandial:  Evening:  Before meals: Taking Insulin?: no  Long acting insulin:  Short acting insulin: Blood Pressure Monitoring: not checking Retinal Examination: Not up to Date Foot Exam: Not up to Date Diabetic Education: Completed Pneumovax: Not up to Date Influenza: Not up to Date Aspirin: no     12/29/2023    8:35 AM  GAD 7 : Generalized Anxiety Score  Nervous, Anxious, on Edge 0  Control/stop worrying 0  Worry too much - different things 0  Trouble relaxing 0  Restless 0  Easily annoyed or irritable 0  Afraid - awful might happen 0  Total GAD 7 Score 0  Anxiety Difficulty Not difficult at all       Outpatient Encounter Medications as of 12/29/2023  Medication Sig   Blood Glucose Monitoring Suppl DEVI 1 each by Does not apply route in the morning, at noon, and at bedtime.  May substitute to any manufacturer covered by patient's insurance.   Glucose Blood (BLOOD GLUCOSE TEST STRIPS) STRP 1 each by In Vitro route in the morning, at noon, and at bedtime. May substitute to any manufacturer covered by patient's insurance.   Lancet Device MISC 1 each by Does not apply route in the morning, at noon, and at bedtime. May substitute to any manufacturer covered by patient's insurance.   Lancets Misc. MISC 1 each by Does not apply route in the morning, at noon, and at bedtime. May substitute to any manufacturer covered by patient's insurance.   metFORMIN (GLUCOPHAGE) 1000 MG tablet Take 1 tablet (1,000 mg total) by mouth 2 (two) times daily with a meal.   omeprazole (PRILOSEC) 20 MG capsule Take 1 capsule (20 mg total) by mouth daily.   sertraline (ZOLOFT) 50 MG tablet Take 1 tablet (50 mg total) by mouth daily.   triamcinolone cream (KENALOG) 0.1 % Apply 1 application topically 2 (two) times daily.   [DISCONTINUED] ibuprofen (ADVIL,MOTRIN) 200 MG tablet Take 200 mg by mouth every 6 (six) hours as needed.   [DISCONTINUED] sertraline (ZOLOFT) 100 MG tablet TAKE 1 TABLET (100 MG TOTAL) BY MOUTH DAILY.   glipiZIDE (GLUCOTROL XL) 10 MG 24 hr tablet Take 1 tablet (10 mg total) by mouth daily with breakfast. (Patient not taking: Reported on 12/29/2023)   lisinopril (ZESTRIL) 10 MG tablet TAKE 1 TABLET BY MOUTH EVERY DAY (Patient not taking: Reported on 12/29/2023)  pioglitazone (ACTOS) 30 MG tablet Take 1 tablet (30 mg total) by mouth daily. (Patient not taking: Reported on 12/29/2023)   valACYclovir (VALTREX) 1000 MG tablet TAKE 2 TABLETS BY MOUTH TWICE A DAY (Patient not taking: Reported on 12/29/2023)   No facility-administered encounter medications on file as of 12/29/2023.    Past Medical History:  Diagnosis Date   Bilateral kidney stones    Diabetes mellitus without complication (HCC)    Frequency of urination    Hematuria    History of kidney stones    Hydronephrosis, bilateral     Nocturia    Right ureteral stone    Urgency of urination     Past Surgical History:  Procedure Laterality Date   BICEPS TENDON REPAIR  FEB 2012   REMOVAL CYST, LEFT LOWER LEG  2005   URETEROSCOPY  01/20/2012   Procedure: URETEROSCOPY;  Surgeon: Antony Haste, MD;  [facility];  Service: Urology;  Laterality: N/A;  BILATERAL URETEROSCOPY, LASER LITHO AND RIGHT URETERAL STENTS  LASER CARM LASER AND RAD TECH OK PER MAIN     Family History  Problem Relation Age of Onset   Stroke Mother    Lung cancer Father     Social History   Socioeconomic History   Marital status: Married    Spouse name: Sotero Brinkmeyer   Number of children: 4   Years of education: Not on file   Highest education level: Associate degree: occupational, Scientist, product/process development, or vocational program  Occupational History   Not on file  Tobacco Use   Smoking status: Never   Smokeless tobacco: Never  Vaping Use   Vaping status: Never Used  Substance and Sexual Activity   Alcohol use: Yes    Comment: occ   Drug use: No   Sexual activity: Yes  Other Topics Concern   Not on file  Social History Narrative   Not on file   Social Drivers of Health   Financial Resource Strain: Not on file  Food Insecurity: Not on file  Transportation Needs: Not on file  Physical Activity: Not on file  Stress: Not on file  Social Connections: Not on file  Intimate Partner Violence: Not on file    Review of Systems  Constitutional: Negative.   HENT: Negative.    Eyes: Negative.   Respiratory: Negative.    Cardiovascular: Negative.   Gastrointestinal: Negative.   Genitourinary: Negative.   Musculoskeletal: Negative.   Skin: Negative.   Neurological: Negative.   Endo/Heme/Allergies: Negative.   Psychiatric/Behavioral: Negative.    All other systems reviewed and are negative.       Objective    BP (!) 138/98   Pulse 97   Temp 97.6 F (36.4 C)   Ht 5' 6.75" (1.695 m)   Wt 255 lb  (115.7 kg)   SpO2 97%   BMI 40.24 kg/m  Vitals:   12/29/23 0826  BP: (!) 138/98  Pulse: 97  Temp: 97.6 F (36.4 C)  SpO2: 97%     Physical Exam Vitals and nursing note reviewed.  Constitutional:      Appearance: Normal appearance. He is normal weight.  HENT:     Head: Normocephalic and atraumatic.     Right Ear: Tympanic membrane, ear canal and external ear normal.     Left Ear: Tympanic membrane, ear canal and external ear normal.     Nose: Nose normal.     Mouth/Throat:     Mouth: Mucous membranes are moist.  Pharynx: Oropharynx is clear.  Eyes:     Extraocular Movements: Extraocular movements intact.     Right eye: Normal extraocular motion and no nystagmus.     Left eye: Normal extraocular motion and no nystagmus.     Conjunctiva/sclera: Conjunctivae normal.     Pupils: Pupils are equal, round, and reactive to light.  Cardiovascular:     Rate and Rhythm: Normal rate and regular rhythm.     Pulses: Normal pulses.     Heart sounds: Normal heart sounds.  Pulmonary:     Effort: Pulmonary effort is normal.     Breath sounds: Normal breath sounds.  Abdominal:     General: Bowel sounds are normal.     Palpations: Abdomen is soft.  Genitourinary:    Comments: Deferred using shared decision making Musculoskeletal:        General: Normal range of motion.     Cervical back: Normal range of motion and neck supple.  Skin:    General: Skin is warm and dry.     Capillary Refill: Capillary refill takes less than 2 seconds.  Neurological:     General: No focal deficit present.     Mental Status: He is alert. Mental status is at baseline.  Psychiatric:        Mood and Affect: Mood normal.        Speech: Speech normal.        Behavior: Behavior normal.        Thought Content: Thought content normal.        Cognition and Memory: Cognition and memory normal.        Judgment: Judgment normal.         Assessment & Plan:   Problem List Items Addressed This Visit      Generalized anxiety disorder   Would like to wean off Zoloft. Decrease to 75mg  daily for 2 weeks then 50mg  daily if tolerating. GAD 0. Symptoms stable for many years.       Relevant Medications   sertraline (ZOLOFT) 50 MG tablet   GERD (gastroesophageal reflux disease)   Well controlled on Prilosec. Elevated HOB if needed and avoid lying down 2-3 hours after eating, avoid coffee, alcohol, chocolate, fatty foods, citrus, carbonated beverages, spicy foods, late meals, and smoking. Return to office if symptoms return or worsen and seek medical care for difficulty swallowing, bleeding, anemia, weight loss, recurrent vomiting        Physical exam, annual - Primary   Today your medical history was reviewed and routine physical exam with labs was performed. Recommend 150 minutes of moderate intensity exercise weekly and consuming a well-balanced diet. Advised to stop smoking if a smoker, avoid smoking if a non-smoker, limit alcohol consumption to 1 drink per day for women and 2 drinks per day for men, and avoid illicit drug use. Counseled on safe sex practices and offered STI testing today. Counseled on the importance of sunscreen use. Counseled in mental health awareness and when to seek medical care. Vaccine maintenance discussed. Appropriate health maintenance items reviewed. Return to office in 1 year for annual physical exam.       Relevant Orders   CBC with Differential/Platelet   Comprehensive metabolic panel with GFR   Lipid panel   Hemoglobin A1c   Hepatitis C antibody   HIV Antibody (routine testing w rflx)   Vitamin B12   Cologuard   Controlled type 2 diabetes mellitus with complication, without long-term current use of insulin (HCC)  Last A1c >7. Continue Metformin 1000mg  BID. A1c and uACR today. Foot exam due. Vaccines tdap and pna today. Retinal eye exam due. Recommend heart healthy diet such as Mediterranean diet with whole grains, fruits, vegetable, fish, lean meats, nuts,  and olive oil. Limit salt. Encouraged moderate walking, 3-5 times/week for 30-50 minutes each session. Aim for at least 150 minutes.week. Goal should be pace of 3 miles/hours, or walking 1.5 miles in 30 minutes. Seek medical care for urinary frequency, extreme thirst, vision changes, lightheadedness, dizziness.  Non-fasting labs today. Follow up in 1-2 weeks.       Relevant Orders   CBC with Differential/Platelet   Comprehensive metabolic panel with GFR   Lipid panel   Hemoglobin A1c   Vitamin B12   Microalbumin / creatinine urine ratio   Hypertension associated with diabetes (HCC)   Not at goal <130/80. Will monitor at home BID and return to office in 1-2 weeks. Non-fasting labs today. Recommend heart healthy diet such as Mediterranean diet with whole grains, fruits, vegetable, fish, lean meats, nuts, and olive oil. Limit salt. Encouraged moderate walking, 3-5 times/week for 30-50 minutes each session. Aim for at least 150 minutes.week. Goal should be pace of 3 miles/hours, or walking 1.5 miles in 30 minutes. Avoid tobacco products. Avoid excess alcohol. Take medications as prescribed and bring medications and blood pressure log with cuff to each office visit. Seek medical care for chest pain, palpitations, shortness of breath with exertion, dizziness/lightheadedness, vision changes, recurrent headaches, or swelling of extremities. Follow up in 1-2 weeks.        Morbid obesity (HCC)   Counseled on importance of weight management for overall health. Encouraged low calorie, heart healthy diet and moderate intensity exercise 150 minutes weekly. This is 3-5 times weekly for 30-50 minutes each session. Goal should be pace of 3 miles/hours, or walking 1.5 miles in 30 minutes and include strength training. Discussed risks of obesity.      Other Visit Diagnoses       Screening for HIV (human immunodeficiency virus)       Relevant Orders   HIV Antibody (routine testing w rflx)     Need for  hepatitis C screening test       Relevant Orders   Hepatitis C antibody     Screening for lipoid disorders       Relevant Orders   Lipid panel     Colon cancer screening       Relevant Orders   Cologuard     Medication management       Relevant Orders   Vitamin B12     Encounter for immunization       Relevant Orders   Pneumococcal conjugate vaccine 20-valent (Completed)   Tdap vaccine greater than or equal to 7yo IM (Completed)       Return in about 2 weeks (around 01/12/2024) for hypertension, follow-up, retinal eye exam.   Park Meo, FNP

## 2023-12-29 NOTE — Patient Instructions (Signed)
 It was great to meet you today and I'm excited to have you join the [facility] practice. I hope you had a positive experience today! If you feel so inclined, please feel free to recommend our practice to friends and family. Kurtis Bushman, FNP-C

## 2023-12-30 LAB — COMPREHENSIVE METABOLIC PANEL WITH GFR
AG Ratio: 1.7 (calc) (ref 1.0–2.5)
ALT: 80 U/L — ABNORMAL HIGH (ref 9–46)
AST: 51 U/L — ABNORMAL HIGH (ref 10–40)
Albumin: 4.7 g/dL (ref 3.6–5.1)
Alkaline phosphatase (APISO): 88 U/L (ref 36–130)
BUN: 14 mg/dL (ref 7–25)
CO2: 28 mmol/L (ref 20–32)
Calcium: 9.5 mg/dL (ref 8.6–10.3)
Chloride: 99 mmol/L (ref 98–110)
Creat: 0.81 mg/dL (ref 0.60–1.29)
Globulin: 2.7 g/dL (ref 1.9–3.7)
Glucose, Bld: 305 mg/dL — ABNORMAL HIGH (ref 65–99)
Potassium: 5.3 mmol/L (ref 3.5–5.3)
Sodium: 140 mmol/L (ref 135–146)
Total Bilirubin: 0.5 mg/dL (ref 0.2–1.2)
Total Protein: 7.4 g/dL (ref 6.1–8.1)
eGFR: 109 mL/min/{1.73_m2} (ref >=60)

## 2023-12-30 LAB — CBC WITH DIFFERENTIAL/PLATELET
Absolute Lymphocytes: 2856 {cells}/uL (ref 850–3900)
Absolute Monocytes: 407 {cells}/uL (ref 200–950)
Basophils Absolute: 96 {cells}/uL (ref 0–200)
Basophils Relative: 1.3 %
Eosinophils Absolute: 784 {cells}/uL — ABNORMAL HIGH (ref 15–500)
Eosinophils Relative: 10.6 %
HCT: 47.9 % (ref 38.5–50.0)
Hemoglobin: 15.3 g/dL (ref 13.2–17.1)
MCH: 28.4 pg (ref 27.0–33.0)
MCHC: 31.9 g/dL — ABNORMAL LOW (ref 32.0–36.0)
MCV: 89 fL (ref 80.0–100.0)
MPV: 10.3 fL (ref 7.5–12.5)
Monocytes Relative: 5.5 %
Neutro Abs: 3256 {cells}/uL (ref 1500–7800)
Neutrophils Relative %: 44 %
Platelets: 272 10*3/uL (ref 140–400)
RBC: 5.38 10*6/uL (ref 4.20–5.80)
RDW: 12.6 % (ref 11.0–15.0)
Total Lymphocyte: 38.6 %
WBC: 7.4 10*3/uL (ref 3.8–10.8)

## 2023-12-30 LAB — HEPATITIS C ANTIBODY: Hepatitis C Ab: NONREACTIVE

## 2023-12-30 LAB — LIPID PANEL
Cholesterol: 298 mg/dL — ABNORMAL HIGH (ref <200)
HDL: 52 mg/dL (ref >=40)
LDL Cholesterol (Calc): 208 mg/dL — ABNORMAL HIGH
Non-HDL Cholesterol (Calc): 246 mg/dL — ABNORMAL HIGH (ref <130)
Total CHOL/HDL Ratio: 5.7 (calc) — ABNORMAL HIGH (ref <5.0)
Triglycerides: 199 mg/dL — ABNORMAL HIGH (ref <150)

## 2023-12-30 LAB — HEMOGLOBIN A1C
Hgb A1c MFr Bld: 11.9 %{Hb} — ABNORMAL HIGH (ref <5.7)
Mean Plasma Glucose: 295 mg/dL
eAG (mmol/L): 16.3 mmol/L

## 2023-12-30 LAB — MICROALBUMIN / CREATININE URINE RATIO
Creatinine, Urine: 65 mg/dL (ref 20–320)
Microalb Creat Ratio: 58 mg/g{creat} — ABNORMAL HIGH (ref <30)
Microalb, Ur: 3.8 mg/dL

## 2023-12-30 LAB — VITAMIN B12: Vitamin B-12: 576 pg/mL (ref 200–1100)

## 2023-12-30 LAB — HIV ANTIBODY (ROUTINE TESTING W REFLEX): HIV 1&2 Ab, 4th Generation: NONREACTIVE

## 2024-01-04 ENCOUNTER — Telehealth: Payer: Self-pay | Admitting: Family Medicine

## 2024-01-04 ENCOUNTER — Other Ambulatory Visit: Payer: Self-pay | Admitting: Family Medicine

## 2024-01-04 DIAGNOSIS — R748 Abnormal levels of other serum enzymes: Secondary | ICD-10-CM

## 2024-01-04 MED ORDER — TIRZEPATIDE 2.5 MG/0.5ML ~~LOC~~ SOAJ: SUBCUTANEOUS | 1 refills | Status: DC

## 2024-01-04 MED ORDER — ROSUVASTATIN CALCIUM 10 MG PO TABS: 10.0000 mg | ORAL_TABLET | Freq: Every day | ORAL | 3 refills | Status: DC

## 2024-01-04 NOTE — Telephone Encounter (Signed)
 Spoke with patient regarding labs. Plan to start Mounjaro, verified no personal history of pancreatitis, no personal or family hx MEN2 or MTC. Discussed possible side effects and plan for up titration, continue Metformin. Has tried Actos and Glipizide in past. Also recommended statin and he is agreeable in addition to  consuming a heart healthy diet such as Mediterranean diet or DASH diet with whole grains, fruits, vegetable, fish, lean meats, nuts, and olive oil. Limit sweets and processed foods. I also encourage moderate intensity exercise 150 minutes weekly. This is 3-5 times weekly for 30-50 minutes each session. Goal should be pace of 3 miles/hours, or walking 1.5 miles in 30 minutes. Returning to office Thursday for retinal eye exam and will have nurse visit for BP check and restart Lisinopril if >130/80. Follow up with me in 1 month.

## 2024-01-05 ENCOUNTER — Ambulatory Visit
Admission: RE | Admit: 2024-01-05 | Discharge: 2024-01-05 | Discharge status: Home / Self Care | Source: Ambulatory Visit | Attending: Family Medicine

## 2024-01-05 DIAGNOSIS — R748 Abnormal levels of other serum enzymes: Secondary | ICD-10-CM

## 2024-01-05 DIAGNOSIS — R945 Abnormal results of liver function studies: Secondary | ICD-10-CM | POA: Diagnosis not present

## 2024-01-06 ENCOUNTER — Ambulatory Visit

## 2024-01-06 VITALS — BP 140/86

## 2024-01-06 DIAGNOSIS — E118 Type 2 diabetes mellitus with unspecified complications: Secondary | ICD-10-CM

## 2024-01-06 DIAGNOSIS — Z7984 Long term (current) use of oral hypoglycemic drugs: Secondary | ICD-10-CM

## 2024-01-06 LAB — HM DIABETES EYE EXAM

## 2024-01-06 NOTE — Progress Notes (Signed)
 Lucas Contreras arrived 01/06/2024 and has given verbal consent to obtain images and complete their overdue diabetic retinal screening.  The images have been sent to an ophthalmologist or optometrist for review and interpretation.  Results will be sent back to Lucas Mis, FNP for review.  Patient has been informed they will be contacted when we receive the results via telephone or MyChart

## 2024-01-08 ENCOUNTER — Other Ambulatory Visit: Payer: Self-pay | Admitting: Family Medicine

## 2024-01-08 DIAGNOSIS — E118 Type 2 diabetes mellitus with unspecified complications: Secondary | ICD-10-CM

## 2024-01-08 NOTE — Telephone Encounter (Signed)
 Requested medications are due for refill today.  yes  Requested medications are on the active medications list.  yes  Last refill. 07/27/2020 #120 0 rf  Future visit scheduled.   yes  Notes to clinic.  Medication last prescribed 07/27/2020    Requested Prescriptions  Pending Prescriptions Disp Refills   metFORMIN  (GLUCOPHAGE ) 1000 MG tablet 120 tablet 0    Sig: Take 1 tablet (1,000 mg total) by mouth 2 (two) times daily with a meal.     Endocrinology:  Diabetes - Biguanides Failed - 01/08/2024  4:28 PM      Failed - HBA1C is between 0 and 7.9 and within 180 days    Hgb A1c MFr Bld  Date Value Ref Range Status  12/29/2023 11.9 (H) <5.7 % of total Hgb Final    Comment:    For someone without known diabetes, a hemoglobin A1c value of 6.5% or greater indicates that they may have  diabetes and this should be confirmed with a follow-up  test. . For someone with known diabetes, a value <7% indicates  that their diabetes is well controlled and a value  greater than or equal to 7% indicates suboptimal  control. A1c targets should be individualized based on  duration of diabetes, age, comorbid conditions, and  other considerations. . Currently, no consensus exists regarding use of hemoglobin A1c for diagnosis of diabetes for children. .          Failed - Valid encounter within last 6 months    Recent Outpatient Visits           1 week ago Physical exam, annual   [facility] Kayla Jeoffrey RAMAN, FNP              Failed - CBC within normal limits and completed in the last 12 months    WBC  Date Value Ref Range Status  12/29/2023 7.4 3.8 - 10.8 Thousand/uL Final   RBC  Date Value Ref Range Status  12/29/2023 5.38 4.20 - 5.80 Million/uL Final   Hemoglobin  Date Value Ref Range Status  12/29/2023 15.3 13.2 - 17.1 g/dL Final   HCT  Date Value Ref Range Status  12/29/2023 47.9 38.5 - 50.0 % Final   MCHC  Date Value Ref Range Status   12/29/2023 31.9 (L) 32.0 - 36.0 g/dL Final    Comment:    For adults, a slight decrease in the calculated MCHC value (in the range of 30 to 32 g/dL) is most likely not clinically significant; however, it should be interpreted with caution in correlation with other red cell parameters and the patient's clinical condition.    [facility]  Date Value Ref Range Status  12/29/2023 28.4 27.0 - 33.0 pg Final   MCV  Date Value Ref Range Status  12/29/2023 89.0 80.0 - 100.0 fL Final   No results found for: PLTCOUNTKUC, LABPLAT, POCPLA RDW  Date Value Ref Range Status  12/29/2023 12.6 11.0 - 15.0 % Final         Passed - Cr in normal range and within 360 days    Creat  Date Value Ref Range Status  12/29/2023 0.81 0.60 - 1.29 mg/dL Final   Creatinine, Urine  Date Value Ref Range Status  12/29/2023 65 20 - 320 mg/dL Final         Passed - eGFR in normal range and within 360 days    GFR, Est African American  Date Value Ref Range Status  12/11/2020 116 > OR = 60 mL/min/1.34m2 Final   GFR, Est Non African American  Date Value Ref Range Status  12/11/2020 100 > OR = 60 mL/min/1.11m2 Final   eGFR  Date Value Ref Range Status  12/29/2023 109 > OR = 60 mL/min/1.7m2 Final         Passed - B12 Level in normal range and within 720 days    Vitamin B-12  Date Value Ref Range Status  12/29/2023 576 200 - 1,100 pg/mL Final

## 2024-01-08 NOTE — Telephone Encounter (Signed)
 Copied from CRM [id number]. Topic: Clinical - Medication Refill >> Jan 08, 2024  1:36 PM Marissa P wrote: Most Recent Primary Care Visit:  Provider: KAYLA GAUZE S  Department: [facility]  Visit Type: TRANSFER OF CARE  Date: 12/29/2023  Medication: metFORMIN  (GLUCOPHAGE ) 1000 MG tablet  Has the patient contacted their pharmacy? No (Agent: If no, request that the patient contact the pharmacy for the refill. If patient does not wish to contact the pharmacy document the reason why and proceed with request.) (Agent: If yes, when and what did the pharmacy advise?)  Is this the correct pharmacy for this prescription? Yes If no, delete pharmacy and type the correct one.  This is the patient's preferred pharmacy:  [facility] [place], [place] - [address]  [address]  [place] [address] Phone: [phone] Fax: [phone]   Has the prescription been filled recently? No  Is the patient out of the medication? Yes  Has the patient been seen for an appointment in the last year OR does the patient have an upcoming appointment? Yes  Can we respond through MyChart? Yes  Agent: Please be advised that Rx refills may take up to 3 business days. We ask that you follow-up with your pharmacy.

## 2024-01-11 MED ORDER — METFORMIN HCL 1000 MG PO TABS: 1000.0000 mg | ORAL_TABLET | Freq: Two times a day (BID) | ORAL | 0 refills | Status: DC

## 2024-01-20 ENCOUNTER — Ambulatory Visit: Admitting: Family Medicine

## 2024-01-27 ENCOUNTER — Encounter (HOSPITAL_COMMUNITY): Payer: Self-pay

## 2024-02-03 ENCOUNTER — Ambulatory Visit: Admitting: Family Medicine

## 2024-02-03 VITALS — BP 162/92 | HR 75 | Ht 66.0 in | Wt 258.5 lb

## 2024-02-03 DIAGNOSIS — E1159 Type 2 diabetes mellitus with other circulatory complications: Secondary | ICD-10-CM | POA: Diagnosis not present

## 2024-02-03 DIAGNOSIS — E118 Type 2 diabetes mellitus with unspecified complications: Secondary | ICD-10-CM | POA: Diagnosis not present

## 2024-02-03 DIAGNOSIS — I152 Hypertension secondary to endocrine disorders: Secondary | ICD-10-CM

## 2024-02-03 DIAGNOSIS — E1165 Type 2 diabetes mellitus with hyperglycemia: Secondary | ICD-10-CM

## 2024-02-03 DIAGNOSIS — E785 Hyperlipidemia, unspecified: Secondary | ICD-10-CM

## 2024-02-03 DIAGNOSIS — E1169 Type 2 diabetes mellitus with other specified complication: Secondary | ICD-10-CM | POA: Insufficient documentation

## 2024-02-03 DIAGNOSIS — F411 Generalized anxiety disorder: Secondary | ICD-10-CM

## 2024-02-03 DIAGNOSIS — Z7985 Long-term (current) use of injectable non-insulin antidiabetic drugs: Secondary | ICD-10-CM

## 2024-02-03 LAB — LIPID PANEL
Cholesterol: 151 mg/dL (ref <200)
HDL: 49 mg/dL (ref >=40)
LDL Cholesterol (Calc): 81 mg/dL
Non-HDL Cholesterol (Calc): 102 mg/dL (ref <130)
Total CHOL/HDL Ratio: 3.1 (calc) (ref <5.0)
Triglycerides: 116 mg/dL (ref <150)

## 2024-02-03 LAB — COMPREHENSIVE METABOLIC PANEL WITH GFR
AG Ratio: 2 (calc) (ref 1.0–2.5)
ALT: 41 U/L (ref 9–46)
AST: 26 U/L (ref 10–40)
Albumin: 4.8 g/dL (ref 3.6–5.1)
Alkaline phosphatase (APISO): 58 U/L (ref 36–130)
BUN: 21 mg/dL (ref 7–25)
CO2: 31 mmol/L (ref 20–32)
Calcium: 10.1 mg/dL (ref 8.6–10.3)
Chloride: 100 mmol/L (ref 98–110)
Creat: 0.84 mg/dL (ref 0.60–1.29)
Globulin: 2.4 g/dL (ref 1.9–3.7)
Glucose, Bld: 202 mg/dL — ABNORMAL HIGH (ref 65–99)
Potassium: 5.3 mmol/L (ref 3.5–5.3)
Sodium: 138 mmol/L (ref 135–146)
Total Bilirubin: 0.4 mg/dL (ref 0.2–1.2)
Total Protein: 7.2 g/dL (ref 6.1–8.1)
eGFR: 108 mL/min/{1.73_m2} (ref >=60)

## 2024-02-03 MED ORDER — METFORMIN HCL 1000 MG PO TABS: 1000.0000 mg | ORAL_TABLET | Freq: Two times a day (BID) | ORAL | 0 refills | Status: DC

## 2024-02-03 MED ORDER — LISINOPRIL 10 MG PO TABS: 10.0000 mg | ORAL_TABLET | Freq: Every day | ORAL | 1 refills | Status: DC

## 2024-02-03 NOTE — Progress Notes (Signed)
 Subjective:  HPI: Lucas Contreras is a 48 y.o. male presenting on 02/03/2024 for No chief complaint on file.   HPI Patient is in today for follow up for HTN, HLD, DM2, and GAD. Mr Kamstra has been doing great with making dietary changes since his initial visit to establish care and discovering his A1c 11.9%. Has started Mounjaro and Metformin  and is tolerating well. He has also start Rosuvastatin  for his elevated cholesterol and is consuming a heart healthy diet. RUQ US  for elevated LFTs showed fatty liver with cholelithiasis. He is doing well on 50mg  3-7 days weekly Zoloft  however does endorse high stress at work.   HYPERTENSION / HYPERLIPIDEMIA Satisfied with current treatment? Not medicated Duration of hypertension: chronic BP monitoring frequency: not checking BP range:  BP medication side effects: n/a Past BP meds: lisinopril  Duration of hyperlipidemia: chronic Cholesterol medication side effects: no Cholesterol supplements: none Past cholesterol medications:  rosuvastatin  Medication compliance: excellent compliance Aspirin: no Recent stressors: yes Recurrent headaches: no Visual changes: no Palpitations: no Dyspnea: no Chest pain: no Lower extremity edema: no Dizzy/lightheaded: no  DIABETES Hypoglycemic episodes:no Polydipsia/polyuria: no Visual disturbance: no Chest pain: no Paresthesias: no Glucose Monitoring: no  Accucheck frequency: Daily  Fasting glucose: 140-200s  Post prandial:  Evening:  Before meals: Taking Insulin?: no  Long acting insulin:  Short acting insulin: Blood Pressure Monitoring: not checking Retinal Examination: Not up to Date Foot Exam: Not up to Date Diabetic Education: Completed Pneumovax: Up to Date Influenza: Up to Date Aspirin: no    Review of Systems  All other systems reviewed and are negative.   Relevant past medical history reviewed and updated as indicated.   Past Medical History:  Diagnosis Date   Bilateral kidney  stones    Diabetes mellitus without complication (HCC)    Frequency of urination    Hematuria    History of kidney stones    Hydronephrosis, bilateral    Nocturia    Right ureteral stone    Urgency of urination      Past Surgical History:  Procedure Laterality Date   BICEPS TENDON REPAIR  FEB 2012   REMOVAL CYST, LEFT LOWER LEG  2005   URETEROSCOPY  01/20/2012   Procedure: URETEROSCOPY;  Surgeon: Moise Anes, MD;  Location: [facility];  Service: Urology;  Laterality: N/A;  BILATERAL URETEROSCOPY, LASER LITHO AND RIGHT URETERAL STENTS  LASER CARM LASER AND RAD TECH OK PER MAIN     Allergies and medications reviewed and updated.   Current Outpatient Medications:    omeprazole  (PRILOSEC) 20 MG capsule, Take 1 capsule (20 mg total) by mouth daily., Disp: 30 capsule, Rfl: 3   rosuvastatin  (CRESTOR ) 10 MG tablet, Take 1 tablet (10 mg total) by mouth daily., Disp: 90 tablet, Rfl: 3   sertraline  (ZOLOFT ) 50 MG tablet, Take 1 tablet (50 mg total) by mouth daily., Disp: 90 tablet, Rfl: 1   tirzepatide (MOUNJARO) 2.5 MG/0.5ML Pen, Inject 2.5 mg into the skin once a week for 28 days, THEN 5 mg once a week for 28 days., Disp: 3 mL, Rfl: 1   Blood Glucose Monitoring Suppl DEVI, 1 each by Does not apply route in the morning, at noon, and at bedtime. May substitute to any manufacturer covered by patient's insurance., Disp: 1 each, Rfl: 0   glipiZIDE  (GLUCOTROL  XL) 10 MG 24 hr tablet, Take 1 tablet (10 mg total) by mouth daily with breakfast. (Patient not taking: Reported on 02/03/2024), Disp: 90 tablet,  Rfl: 0   lisinopril  (ZESTRIL ) 10 MG tablet, Take 1 tablet (10 mg total) by mouth daily., Disp: 90 tablet, Rfl: 1   metFORMIN  (GLUCOPHAGE ) 1000 MG tablet, Take 1 tablet (1,000 mg total) by mouth 2 (two) times daily with a meal., Disp: 120 tablet, Rfl: 0   pioglitazone  (ACTOS ) 30 MG tablet, Take 1 tablet (30 mg total) by mouth daily. (Patient not taking: Reported on  02/03/2024), Disp: 90 tablet, Rfl: 0   triamcinolone  cream (KENALOG ) 0.1 %, Apply 1 application topically 2 (two) times daily. (Patient not taking: Reported on 02/03/2024), Disp: 30 g, Rfl: 1   valACYclovir  (VALTREX ) 1000 MG tablet, TAKE 2 TABLETS BY MOUTH TWICE A DAY (Patient not taking: Reported on 02/03/2024), Disp: 4 tablet, Rfl: 0  No Known Allergies  Objective:   BP (!) 162/92   Pulse 75   Ht 5\' 6"  (1.676 m)   Wt 258 lb 8 oz (117.3 kg)   SpO2 98%   BMI 41.72 kg/m      02/03/2024    8:13 AM 01/06/2024    9:42 AM 12/29/2023    8:26 AM  Vitals with BMI  Height 5\' 6"   5' 6.75"  Weight 258 lbs 8 oz  255 lbs  BMI 41.74  40.26  Systolic 162 140 161  Diastolic 92 86 98  Pulse 75  97     Physical Exam Vitals and nursing note reviewed.  Constitutional:      Appearance: Normal appearance. He is normal weight.  HENT:     Head: Normocephalic and atraumatic.  Cardiovascular:     Rate and Rhythm: Normal rate and regular rhythm.     Pulses: Normal pulses.     Heart sounds: Normal heart sounds.  Pulmonary:     Effort: Pulmonary effort is normal.     Breath sounds: Normal breath sounds.  Abdominal:     General: Bowel sounds are normal.     Palpations: Abdomen is soft.  Skin:    General: Skin is warm and dry.     Capillary Refill: Capillary refill takes less than 2 seconds.  Neurological:     General: No focal deficit present.     Mental Status: He is alert and oriented to person, place, and time. Mental status is at baseline.  Psychiatric:        Mood and Affect: Mood normal.        Behavior: Behavior normal.        Thought Content: Thought content normal.        Judgment: Judgment normal.    Diabetic Foot Exam - Simple   Simple Foot Form Visual Inspection No deformities, no ulcerations, no other skin breakdown bilaterally: Yes Sensation Testing Intact to touch and monofilament testing bilaterally: Yes Pulse Check Posterior Tibialis and Dorsalis pulse intact  bilaterally: Yes Comments      Assessment & Plan:  Hyperlipidemia associated with type 2 diabetes mellitus (HCC) Assessment & Plan: Continue Rosivastatin 10mg  daily. Labs today and may need to increase. I recommend consuming a heart healthy diet such as Mediterranean diet or DASH diet with whole grains, fruits, vegetable, fish, lean meats, nuts, and olive oil. Limit sweets and processed foods. I also encourage moderate intensity exercise 150 minutes weekly. This is 3-5 times weekly for 30-50 minutes each session. Goal should be pace of 3 miles/hours, or walking 1.5 miles in 30 minutes. The 10-year ASCVD risk score (Arnett DK, et al., 2019) is: 16%   Orders: -  Comprehensive metabolic panel with GFR -     Lipid panel  Controlled type 2 diabetes mellitus with complication, without long-term current use of insulin (HCC) Assessment & Plan: Last A1c 11.9%. Continue Metformin  1000mg  BID, mounjaro 2.5mg  weekly and increase as tolerated. A1c and uACR UTD. Foot exam today. Vaccines UTD. Retinal eye exam due. Recommend heart healthy diet such as Mediterranean diet with whole grains, fruits, vegetable, fish, lean meats, nuts, and olive oil. Limit salt. Encouraged moderate walking, 3-5 times/week for 30-50 minutes each session. Aim for at least 150 minutes.week. Goal should be pace of 3 miles/hours, or walking 1.5 miles in 30 minutes. Seek medical care for urinary frequency, extreme thirst, vision changes, lightheadedness, dizziness.  Follow up in 4 weeks.   Orders: -     metFORMIN  HCl; Take 1 tablet (1,000 mg total) by mouth 2 (two) times daily with a meal.  Dispense: 120 tablet; Refill: 0  Hypertension associated with diabetes [facility]) Assessment & Plan: Not at goal <130/80. Restart lisinopril  10mg  daily. EKG NSR. Recommend heart healthy diet such as Mediterranean diet with whole grains, fruits, vegetable, fish, lean meats, nuts, and olive oil. Limit salt. Encouraged moderate walking, 3-5 times/week  for 30-50 minutes each session. Aim for at least 150 minutes.week. Goal should be pace of 3 miles/hours, or walking 1.5 miles in 30 minutes. Avoid tobacco products. Avoid excess alcohol. Take medications as prescribed and bring medications and blood pressure log with cuff to each office visit. Seek medical care for chest pain, palpitations, shortness of breath with exertion, dizziness/lightheadedness, vision changes, recurrent headaches, or swelling of extremities. Follow up in 4 weeks.    Orders: -     Lisinopril ; Take 1 tablet (10 mg total) by mouth daily.  Dispense: 90 tablet; Refill: 1 -     EKG 12-Lead  Generalized anxiety disorder Assessment & Plan: Doing well on Zoloft  50mg  daily. Will continue this.    Type 2 diabetes mellitus with hyperglycemia, without long-term current use of insulin (HCC) Assessment & Plan: Last A1c 11.9%. Continue Metformin  1000mg  BID, mounjaro 2.5mg  weekly and increase as tolerated. A1c and uACR UTD. Foot exam today. Vaccines UTD. Retinal eye exam due. Recommend heart healthy diet such as Mediterranean diet with whole grains, fruits, vegetable, fish, lean meats, nuts, and olive oil. Limit salt. Encouraged moderate walking, 3-5 times/week for 30-50 minutes each session. Aim for at least 150 minutes.week. Goal should be pace of 3 miles/hours, or walking 1.5 miles in 30 minutes. Seek medical care for urinary frequency, extreme thirst, vision changes, lightheadedness, dizziness.  Follow up in 4 weeks.    Diabetes mellitus treated with injections of non-insulin medication (HCC)     Follow up plan: Return in about 4 weeks (around 03/02/2024) for hypertension.  Jenelle Mis, FNP

## 2024-02-03 NOTE — Assessment & Plan Note (Signed)
 Doing well on Zoloft  50mg  daily. Will continue this.

## 2024-02-03 NOTE — Assessment & Plan Note (Addendum)
 Not at goal <130/80. Restart lisinopril  10mg  daily. EKG NSR. Recommend heart healthy diet such as Mediterranean diet with whole grains, fruits, vegetable, fish, lean meats, nuts, and olive oil. Limit salt. Encouraged moderate walking, 3-5 times/week for 30-50 minutes each session. Aim for at least 150 minutes.week. Goal should be pace of 3 miles/hours, or walking 1.5 miles in 30 minutes. Avoid tobacco products. Avoid excess alcohol. Take medications as prescribed and bring medications and blood pressure log with cuff to each office visit. Seek medical care for chest pain, palpitations, shortness of breath with exertion, dizziness/lightheadedness, vision changes, recurrent headaches, or swelling of extremities. Follow up in 4 weeks.

## 2024-02-03 NOTE — Assessment & Plan Note (Addendum)
 Last A1c 11.9%. Continue Metformin  1000mg  BID, mounjaro 2.5mg  weekly and increase as tolerated. A1c and uACR UTD. Foot exam today. Vaccines UTD. Retinal eye exam due. Recommend heart healthy diet such as Mediterranean diet with whole grains, fruits, vegetable, fish, lean meats, nuts, and olive oil. Limit salt. Encouraged moderate walking, 3-5 times/week for 30-50 minutes each session. Aim for at least 150 minutes.week. Goal should be pace of 3 miles/hours, or walking 1.5 miles in 30 minutes. Seek medical care for urinary frequency, extreme thirst, vision changes, lightheadedness, dizziness.  Follow up in 4 weeks.

## 2024-02-03 NOTE — Assessment & Plan Note (Signed)
 Continue Rosivastatin 10mg  daily. Labs today and may need to increase. I recommend consuming a heart healthy diet such as Mediterranean diet or DASH diet with whole grains, fruits, vegetable, fish, lean meats, nuts, and olive oil. Limit sweets and processed foods. I also encourage moderate intensity exercise 150 minutes weekly. This is 3-5 times weekly for 30-50 minutes each session. Goal should be pace of 3 miles/hours, or walking 1.5 miles in 30 minutes. The 10-year ASCVD risk score (Arnett DK, et al., 2019) is: 16%

## 2024-02-04 ENCOUNTER — Ambulatory Visit: Payer: Self-pay | Admitting: Family Medicine

## 2024-02-24 ENCOUNTER — Encounter: Payer: Self-pay | Admitting: Family Medicine

## 2024-02-29 ENCOUNTER — Other Ambulatory Visit: Payer: Self-pay | Admitting: Family Medicine

## 2024-02-29 NOTE — Telephone Encounter (Unsigned)
 Copied from CRM [id number]. Topic: Clinical - Medication Refill >> Feb 29, 2024  9:52 AM Emylou G wrote: Medication: tirzepatide (MOUNJARO) 2.5 MG/0.5ML Pen  Has the patient contacted their pharmacy? No (Agent: If no, request that the patient contact the pharmacy for the refill. If patient does not wish to contact the pharmacy document the reason why and proceed with request.) (Agent: If yes, when and what did the pharmacy advise?)  This is the patient's preferred pharmacy:  [facility] [place], [place] - [address]  [address]  [place] [address] Phone: [phone] Fax: [phone]  Is this the correct pharmacy for this prescription? Yes If no, delete pharmacy and type the correct one.   Has the prescription been filled recently? No  Is the patient out of the medication? Yes  Has the patient been seen for an appointment in the last year OR does the patient have an upcoming appointment? Yes  Can we respond through MyChart? Yes  Agent: Please be advised that Rx refills may take up to 3 business days. We ask that you follow-up with your pharmacy.

## 2024-03-01 MED ORDER — TIRZEPATIDE 2.5 MG/0.5ML ~~LOC~~ SOAJ: SUBCUTANEOUS | 1 refills | Status: DC

## 2024-03-01 NOTE — Telephone Encounter (Signed)
 Requested medications are due for refill today.  yes  Requested medications are on the active medications list.  no  Last refill. 01/04/2024 3mL 1 rf  Future visit scheduled.   yes  Notes to clinic.  Medication not assigned to a protocol. Med not on med list.    Requested Prescriptions  Pending Prescriptions Disp Refills   tirzepatide (MOUNJARO) 2.5 MG/0.5ML Pen 3 mL 1    Sig: Inject 2.5 mg into the skin once a week for 28 days, THEN 5 mg once a week for 28 days.     Off-Protocol Failed - 03/01/2024 10:57 AM      Failed - Medication not assigned to a protocol, review manually.      Failed - Valid encounter within last 12 months    Recent Outpatient Visits           3 weeks ago Hyperlipidemia associated with type 2 diabetes mellitus (HCC)   [facility] Jenelle Mis, FNP   2 months ago Physical exam, annual   [facility] Jenelle Mis, FNP

## 2024-03-10 ENCOUNTER — Encounter: Payer: Self-pay | Admitting: Family Medicine

## 2024-03-10 ENCOUNTER — Ambulatory Visit: Admitting: Family Medicine

## 2024-03-10 VITALS — BP 122/82 | HR 85 | Temp 98.2°F | Ht 66.0 in | Wt 257.2 lb

## 2024-03-10 DIAGNOSIS — E1165 Type 2 diabetes mellitus with hyperglycemia: Secondary | ICD-10-CM

## 2024-03-10 DIAGNOSIS — Z7984 Long term (current) use of oral hypoglycemic drugs: Secondary | ICD-10-CM | POA: Diagnosis not present

## 2024-03-10 DIAGNOSIS — E1169 Type 2 diabetes mellitus with other specified complication: Secondary | ICD-10-CM | POA: Diagnosis not present

## 2024-03-10 DIAGNOSIS — E785 Hyperlipidemia, unspecified: Secondary | ICD-10-CM

## 2024-03-10 MED ORDER — TIRZEPATIDE 5 MG/0.5ML ~~LOC~~ SOAJ: 5.0000 mg | SUBCUTANEOUS | 1 refills | Status: DC

## 2024-03-10 NOTE — Progress Notes (Signed)
 Subjective:  HPI: Lucas Contreras is a 48 y.o. male presenting on 03/10/2024 for Medical Management of Chronic Issues (4 wk f/u HTN )   HPI Patient is in today for follow up for HTN and DM. Was started on Lisinopril  10mg  daily and Mounjaro  5mg  weekly at last OV 1 month ago. Mounjaro  is causing some malaise. He has been out of mounjaro  for 2 weeks due to difficulty obtaining refill with pharmacy and insurance.   HYPERTENSION without Chronic Kidney Disease Hypertension status: better  Satisfied with current treatment? yes Duration of hypertension: chronic BP monitoring frequency:  rarely BP range:  BP medication side effects:  no Medication compliance: excellent compliance Previous BP meds: lisinopril  Aspirin: no Recurrent headaches: no Visual changes: no Palpitations: no Dyspnea: no Chest pain: no Lower extremity edema: no Dizzy/lightheaded: no  DIABETES Hypoglycemic episodes:no Polydipsia/polyuria: no Visual disturbance: no Chest pain: no Paresthesias: no Glucose Monitoring: yes  Accucheck frequency: Daily  Fasting glucose: 170-180  Post prandial:  Evening:  Before meals: Taking Insulin?: no  Long acting insulin:  Short acting insulin: Blood Pressure Monitoring: not checking Retinal Examination: Up to Date Foot Exam: Up to Date Diabetic Education: Completed Pneumovax: Up to Date Influenza: Up to Date Aspirin: no   Review of Systems  All other systems reviewed and are negative.   Relevant past medical history reviewed and updated as indicated.   Past Medical History:  Diagnosis Date   Bilateral kidney stones    Diabetes mellitus without complication (HCC)    Frequency of urination    Hematuria    History of kidney stones    Hydronephrosis, bilateral    Nocturia    Right ureteral stone    Urgency of urination      Past Surgical History:  Procedure Laterality Date   BICEPS TENDON REPAIR  FEB 2012   REMOVAL CYST, LEFT LOWER LEG  2005    URETEROSCOPY  01/20/2012   Procedure: URETEROSCOPY;  Surgeon: Moise Anes, MD;  Location: [facility];  Service: Urology;  Laterality: N/A;  BILATERAL URETEROSCOPY, LASER LITHO AND RIGHT URETERAL STENTS  LASER CARM LASER AND RAD TECH OK PER MAIN     Allergies and medications reviewed and updated.   Current Outpatient Medications:    Blood Glucose Monitoring Suppl DEVI, 1 each by Does not apply route in the morning, at noon, and at bedtime. May substitute to any manufacturer covered by patient's insurance., Disp: 1 each, Rfl: 0   lisinopril  (ZESTRIL ) 10 MG tablet, Take 1 tablet (10 mg total) by mouth daily., Disp: 90 tablet, Rfl: 1   metFORMIN  (GLUCOPHAGE ) 1000 MG tablet, Take 1 tablet (1,000 mg total) by mouth 2 (two) times daily with a meal., Disp: 120 tablet, Rfl: 0   omeprazole  (PRILOSEC) 20 MG capsule, Take 1 capsule (20 mg total) by mouth daily., Disp: 30 capsule, Rfl: 3   rosuvastatin  (CRESTOR ) 10 MG tablet, Take 1 tablet (10 mg total) by mouth daily., Disp: 90 tablet, Rfl: 3   sertraline  (ZOLOFT ) 50 MG tablet, Take 1 tablet (50 mg total) by mouth daily., Disp: 90 tablet, Rfl: 1   tirzepatide  (MOUNJARO ) 5 MG/0.5ML Pen, Inject 5 mg into the skin once a week., Disp: 6 mL, Rfl: 1   triamcinolone  cream (KENALOG ) 0.1 %, Apply 1 application topically 2 (two) times daily. (Patient taking differently: Apply 1 application  topically 2 (two) times daily as needed.), Disp: 30 g, Rfl: 1   glipiZIDE  (GLUCOTROL  XL) 10 MG 24 hr  tablet, Take 1 tablet (10 mg total) by mouth daily with breakfast. (Patient not taking: Reported on 03/10/2024), Disp: 90 tablet, Rfl: 0   pioglitazone  (ACTOS ) 30 MG tablet, Take 1 tablet (30 mg total) by mouth daily. (Patient not taking: Reported on 03/10/2024), Disp: 90 tablet, Rfl: 0   valACYclovir  (VALTREX ) 1000 MG tablet, TAKE 2 TABLETS BY MOUTH TWICE A DAY (Patient not taking: Reported on 03/10/2024), Disp: 4 tablet, Rfl: 0  No Known  Allergies  Objective:   BP 122/82   Pulse 85   Temp 98.2 F (36.8 C)   Ht 5' 6 (1.676 m)   Wt 257 lb 3.2 oz (116.7 kg)   SpO2 97%   BMI 41.51 kg/m      03/10/2024    9:09 AM 02/03/2024    8:13 AM 01/06/2024    9:42 AM  Vitals with BMI  Height 5' 6 5' 6   Weight 257 lbs 3 oz 258 lbs 8 oz   BMI 41.53 41.74   Systolic 122 162 409  Diastolic 82 92 86  Pulse 85 75      Physical Exam Vitals and nursing note reviewed.  Constitutional:      Appearance: Normal appearance. He is obese.  HENT:     Head: Normocephalic and atraumatic.   Cardiovascular:     Rate and Rhythm: Normal rate and regular rhythm.     Pulses: Normal pulses.     Heart sounds: Normal heart sounds.  Pulmonary:     Effort: Pulmonary effort is normal.     Breath sounds: Normal breath sounds.   Skin:    General: Skin is warm and dry.     Capillary Refill: Capillary refill takes less than 2 seconds.   Neurological:     General: No focal deficit present.     Mental Status: He is alert and oriented to person, place, and time. Mental status is at baseline.   Psychiatric:        Mood and Affect: Mood normal.        Behavior: Behavior normal.        Thought Content: Thought content normal.        Judgment: Judgment normal.     Assessment & Plan:  Type 2 diabetes mellitus with hyperglycemia, without long-term current use of insulin (HCC) Assessment & Plan: Last A1c 11.9%. Continue Metformin  1000mg  BID. Restart mounjaro  2.5mg  weekly for 2 weeks then increase to 5mg  weekly and increase as tolerated. A1c and uACR UTD. Foot exam today. Vaccines UTD. Retinal eye exam due. Recommend heart healthy diet such as Mediterranean diet with whole grains, fruits, vegetable, fish, lean meats, nuts, and olive oil. Limit salt. Encouraged moderate walking, 3-5 times/week for 30-50 minutes each session. Aim for at least 150 minutes.week. Goal should be pace of 3 miles/hours, or walking 1.5 miles in 30 minutes. Seek medical  care for urinary frequency, extreme thirst, vision changes, lightheadedness, dizziness.  Follow up in 5 weeks.   Orders: -     Hemoglobin A1c; Standing  Hyperlipidemia associated with type 2 diabetes mellitus (HCC) Assessment & Plan: Continue Rosivastatin 10mg  daily. LDL 81, continue to work on diet and exercise as well as weight management. I recommend consuming a heart healthy diet such as Mediterranean diet or DASH diet with whole grains, fruits, vegetable, fish, lean meats, nuts, and olive oil. Limit sweets and processed foods. I also encourage moderate intensity exercise 150 minutes weekly. This is 3-5 times weekly for 30-50 minutes each session.  Goal should be pace of 3 miles/hours, or walking 1.5 miles in 30 minutes. The 10-year ASCVD risk score (Arnett DK, et al., 2019) is: 3.9%    Other orders -     Tirzepatide ; Inject 5 mg into the skin once a week.  Dispense: 6 mL; Refill: 1     Follow up plan: Return in about 5 weeks (around 04/14/2024) for chronic follow-up with labs 1 week prior.  Jenelle Mis, FNP

## 2024-03-10 NOTE — Assessment & Plan Note (Signed)
 Last A1c 11.9%. Continue Metformin  1000mg  BID. Restart mounjaro  2.5mg  weekly for 2 weeks then increase to 5mg  weekly and increase as tolerated. A1c and uACR UTD. Foot exam today. Vaccines UTD. Retinal eye exam due. Recommend heart healthy diet such as Mediterranean diet with whole grains, fruits, vegetable, fish, lean meats, nuts, and olive oil. Limit salt. Encouraged moderate walking, 3-5 times/week for 30-50 minutes each session. Aim for at least 150 minutes.week. Goal should be pace of 3 miles/hours, or walking 1.5 miles in 30 minutes. Seek medical care for urinary frequency, extreme thirst, vision changes, lightheadedness, dizziness.  Follow up in 5 weeks.

## 2024-03-10 NOTE — Assessment & Plan Note (Signed)
 Continue Rosivastatin 10mg  daily. LDL 81, continue to work on diet and exercise as well as weight management. I recommend consuming a heart healthy diet such as Mediterranean diet or DASH diet with whole grains, fruits, vegetable, fish, lean meats, nuts, and olive oil. Limit sweets and processed foods. I also encourage moderate intensity exercise 150 minutes weekly. This is 3-5 times weekly for 30-50 minutes each session. Goal should be pace of 3 miles/hours, or walking 1.5 miles in 30 minutes. The 10-year ASCVD risk score (Arnett DK, et al., 2019) is: 3.9%

## 2024-04-14 ENCOUNTER — Other Ambulatory Visit

## 2024-04-14 DIAGNOSIS — E1165 Type 2 diabetes mellitus with hyperglycemia: Secondary | ICD-10-CM | POA: Diagnosis not present

## 2024-04-14 LAB — HEMOGLOBIN A1C
Hgb A1c MFr Bld: 8.1 % — ABNORMAL HIGH (ref <5.7)
Mean Plasma Glucose: 186 mg/dL
eAG (mmol/L): 10.3 mmol/L

## 2024-04-20 ENCOUNTER — Ambulatory Visit: Admitting: Family Medicine

## 2024-04-20 ENCOUNTER — Encounter: Payer: Self-pay | Admitting: Family Medicine

## 2024-04-20 VITALS — BP 134/80 | HR 99 | Temp 98.5°F | Ht 66.0 in | Wt 257.5 lb

## 2024-04-20 DIAGNOSIS — E1169 Type 2 diabetes mellitus with other specified complication: Secondary | ICD-10-CM | POA: Diagnosis not present

## 2024-04-20 DIAGNOSIS — F411 Generalized anxiety disorder: Secondary | ICD-10-CM

## 2024-04-20 DIAGNOSIS — E1165 Type 2 diabetes mellitus with hyperglycemia: Secondary | ICD-10-CM | POA: Diagnosis not present

## 2024-04-20 DIAGNOSIS — E785 Hyperlipidemia, unspecified: Secondary | ICD-10-CM

## 2024-04-20 DIAGNOSIS — E1159 Type 2 diabetes mellitus with other circulatory complications: Secondary | ICD-10-CM | POA: Diagnosis not present

## 2024-04-20 DIAGNOSIS — I152 Hypertension secondary to endocrine disorders: Secondary | ICD-10-CM

## 2024-04-20 MED ORDER — ROSUVASTATIN CALCIUM 10 MG PO TABS
10.0000 mg | ORAL_TABLET | Freq: Every day | ORAL | 3 refills | Status: AC
Start: 2024-04-20 — End: ?

## 2024-04-20 MED ORDER — LISINOPRIL 10 MG PO TABS: 10.0000 mg | ORAL_TABLET | Freq: Every day | ORAL | 1 refills | Status: DC

## 2024-04-20 MED ORDER — SERTRALINE HCL 25 MG PO TABS: 25.0000 mg | ORAL_TABLET | Freq: Every day | ORAL | 1 refills | Status: DC

## 2024-04-20 MED ORDER — TIRZEPATIDE 5 MG/0.5ML ~~LOC~~ SOAJ: SUBCUTANEOUS | 0 refills | Status: AC

## 2024-04-20 NOTE — Progress Notes (Signed)
 Subjective:  HPI: Lucas Contreras is a 48 y.o. male presenting on 04/20/2024 for Medical Management of Chronic Issues   HPI Patient is in today for chronic condition management. PMH includes DM2, HLD, HTN, anxiety. Most recent LDL 81, HDL 49, total cholesterol 848, A1c 8.1% on Metformin  1000mg   BID, Mounjaro  2.5mg  weekly, Lisinopril  10mg  daily, and Rosuvastatin  10mg  daily. Tolerating Mounjaro  well without side effects. Is also working on diet and exercise improvements.  HYPERTENSION / HYPERLIPIDEMIA Satisfied with current treatment? yes Duration of hypertension: chronic BP monitoring frequency: not checking BP range:  BP medication side effects: no Past BP meds: lisinopril  Duration of hyperlipidemia: chronic Cholesterol medication side effects: no Cholesterol supplements: none Past cholesterol medications: rosuvastatin  (crestor ) Medication compliance: excellent compliance Aspirin: no Recent stressors: no Recurrent headaches: no Visual changes: no Palpitations: no Dyspnea: no Chest pain: no Lower extremity edema: no Dizzy/lightheaded: no  DIABETES Hypoglycemic episodes:no Polydipsia/polyuria: no Visual disturbance: no Chest pain: no Paresthesias: no Glucose Monitoring: yes  Accucheck frequency: every other day  Fasting glucose: 140-180  Post prandial:  Evening:  Before meals: Taking Insulin?: no  Long acting insulin:  Short acting insulin: Blood Pressure Monitoring: not checking Retinal Examination: Up to Date Foot Exam: Up to Date Diabetic Education: Completed Pneumovax: Up to Date Influenza: Up to Date Aspirin: no   Review of Systems  All other systems reviewed and are negative.   Relevant past medical history reviewed and updated as indicated.   Past Medical History:  Diagnosis Date   Bilateral kidney stones    Diabetes mellitus without complication (HCC)    Frequency of urination    Hematuria    History of kidney stones    Hydronephrosis,  bilateral    Nocturia    Right ureteral stone    Urgency of urination      Past Surgical History:  Procedure Laterality Date   BICEPS TENDON REPAIR  FEB 2012   REMOVAL CYST, LEFT LOWER LEG  2005   URETEROSCOPY  01/20/2012   Procedure: URETEROSCOPY;  Surgeon: Donnice Gwenyth Brooks, MD;  Location: [facility];  Service: Urology;  Laterality: N/A;  BILATERAL URETEROSCOPY, LASER LITHO AND RIGHT URETERAL STENTS  LASER CARM LASER AND RAD TECH OK PER MAIN     Allergies and medications reviewed and updated.   Current Outpatient Medications:    Blood Glucose Monitoring Suppl DEVI, 1 each by Does not apply route in the morning, at noon, and at bedtime. May substitute to any manufacturer covered by patient's insurance., Disp: 1 each, Rfl: 0   lisinopril  (ZESTRIL ) 10 MG tablet, Take 1 tablet (10 mg total) by mouth daily., Disp: 90 tablet, Rfl: 1   metFORMIN  (GLUCOPHAGE ) 1000 MG tablet, Take 1 tablet (1,000 mg total) by mouth 2 (two) times daily with a meal., Disp: 120 tablet, Rfl: 0   omeprazole  (PRILOSEC) 20 MG capsule, Take 1 capsule (20 mg total) by mouth daily., Disp: 30 capsule, Rfl: 3   rosuvastatin  (CRESTOR ) 10 MG tablet, Take 1 tablet (10 mg total) by mouth daily., Disp: 90 tablet, Rfl: 3   sertraline  (ZOLOFT ) 25 MG tablet, Take 1 tablet (25 mg total) by mouth daily., Disp: 90 tablet, Rfl: 1   tirzepatide  (MOUNJARO ) 5 MG/0.5ML Pen, Inject 5 mg into the skin once a week for 28 days, THEN 7.5 mg once a week for 28 days., Disp: 5 mL, Rfl: 0  No Known Allergies  Objective:   BP 134/80   Pulse 99   Temp 98.5  F (36.9 C)   Ht 5' 6 (1.676 m)   Wt 257 lb 8 oz (116.8 kg)   SpO2 99%   BMI 41.56 kg/m      04/20/2024    7:51 AM 03/10/2024    9:09 AM 02/03/2024    8:13 AM  Vitals with BMI  Height 5' 6 5' 6 5' 6  Weight 257 lbs 8 oz 257 lbs 3 oz 258 lbs 8 oz  BMI 41.58 41.53 41.74  Systolic 134 122 837  Diastolic 80 82 92  Pulse 99 85 75     Physical  Exam Vitals and nursing note reviewed.  Constitutional:      Appearance: Normal appearance. He is obese.  HENT:     Head: Normocephalic and atraumatic.  Cardiovascular:     Rate and Rhythm: Normal rate and regular rhythm.     Pulses: Normal pulses.     Heart sounds: Normal heart sounds.  Pulmonary:     Effort: Pulmonary effort is normal.     Breath sounds: Normal breath sounds.  Skin:    General: Skin is warm and dry.     Capillary Refill: Capillary refill takes less than 2 seconds.  Neurological:     General: No focal deficit present.     Mental Status: He is alert and oriented to person, place, and time. Mental status is at baseline.  Psychiatric:        Mood and Affect: Mood normal.        Behavior: Behavior normal.        Thought Content: Thought content normal.        Judgment: Judgment normal.     Assessment & Plan:  Type 2 diabetes mellitus with hyperglycemia, without long-term current use of insulin (HCC) Assessment & Plan: A1c improved to 8.1%. Continue Metformin  1000mg  BID and increase mounjaro  to 5mg  weekly then increase as tolerated. A1c and uACR UTD. Foot exam today. Vaccines UTD. Retinal eye exam due. Recommend heart healthy diet such as Mediterranean diet with whole grains, fruits, vegetable, fish, lean meats, nuts, and olive oil. Limit salt. Encouraged moderate walking, 3-5 times/week for 30-50 minutes each session. Aim for at least 150 minutes.week. Goal should be pace of 3 miles/hours, or walking 1.5 miles in 30 minutes. Seek medical care for urinary frequency, extreme thirst, vision changes, lightheadedness, dizziness.  Follow up in 3 months   Hypertension associated with diabetes [facility]) Assessment & Plan: Continue lisinopril  10mg  daily. Recommend heart healthy diet such as Mediterranean diet with whole grains, fruits, vegetable, fish, lean meats, nuts, and olive oil. Limit salt. Encouraged moderate walking, 3-5 times/week for 30-50 minutes each session. Aim for  at least 150 minutes.week. Goal should be pace of 3 miles/hours, or walking 1.5 miles in 30 minutes. Avoid tobacco products. Avoid excess alcohol. Take medications as prescribed and bring medications and blood pressure log with cuff to each office visit. Seek medical care for chest pain, palpitations, shortness of breath with exertion, dizziness/lightheadedness, vision changes, recurrent headaches, or swelling of extremities. Follow up in 3 months   Orders: -     Lisinopril ; Take 1 tablet (10 mg total) by mouth daily.  Dispense: 90 tablet; Refill: 1  Hyperlipidemia associated with type 2 diabetes mellitus (HCC) Assessment & Plan: Continue Rosivastatin 10mg  daily.Continue to work on diet and exercise as well as weight management. I recommend consuming a heart healthy diet such as Mediterranean diet or DASH diet with whole grains, fruits, vegetable, fish, lean meats, nuts, and  olive oil. Limit sweets and processed foods. I also encourage moderate intensity exercise 150 minutes weekly. This is 3-5 times weekly for 30-50 minutes each session. Goal should be pace of 3 miles/hours, or walking 1.5 miles in 30 minutes. The 10-year ASCVD risk score (Arnett DK, et al., 2019) is: 4.6%    Generalized anxiety disorder Assessment & Plan: Doing well on Zoloft  50mg  daily. Would like to try 25mg  daily. Follow up in 3 months or sooner if needed.   Other orders -     Rosuvastatin  Calcium ; Take 1 tablet (10 mg total) by mouth daily.  Dispense: 90 tablet; Refill: 3 -     Tirzepatide ; Inject 5 mg into the skin once a week for 28 days, THEN 7.5 mg once a week for 28 days.  Dispense: 5 mL; Refill: 0 -     Sertraline  HCl; Take 1 tablet (25 mg total) by mouth daily.  Dispense: 90 tablet; Refill: 1     Follow up plan: Return in about 3 months (around 07/21/2024) for chronic follow-up with labs 1 week prior.  Jeoffrey GORMAN Barrio, FNP

## 2024-04-20 NOTE — Assessment & Plan Note (Signed)
 Continue lisinopril  10mg  daily. Recommend heart healthy diet such as Mediterranean diet with whole grains, fruits, vegetable, fish, lean meats, nuts, and olive oil. Limit salt. Encouraged moderate walking, 3-5 times/week for 30-50 minutes each session. Aim for at least 150 minutes.week. Goal should be pace of 3 miles/hours, or walking 1.5 miles in 30 minutes. Avoid tobacco products. Avoid excess alcohol. Take medications as prescribed and bring medications and blood pressure log with cuff to each office visit. Seek medical care for chest pain, palpitations, shortness of breath with exertion, dizziness/lightheadedness, vision changes, recurrent headaches, or swelling of extremities. Follow up in 3 months

## 2024-04-20 NOTE — Assessment & Plan Note (Signed)
 Doing well on Zoloft  50mg  daily. Would like to try 25mg  daily. Follow up in 3 months or sooner if needed.

## 2024-04-20 NOTE — Assessment & Plan Note (Signed)
 A1c improved to 8.1%. Continue Metformin  1000mg  BID and increase mounjaro  to 5mg  weekly then increase as tolerated. A1c and uACR UTD. Foot exam today. Vaccines UTD. Retinal eye exam due. Recommend heart healthy diet such as Mediterranean diet with whole grains, fruits, vegetable, fish, lean meats, nuts, and olive oil. Limit salt. Encouraged moderate walking, 3-5 times/week for 30-50 minutes each session. Aim for at least 150 minutes.week. Goal should be pace of 3 miles/hours, or walking 1.5 miles in 30 minutes. Seek medical care for urinary frequency, extreme thirst, vision changes, lightheadedness, dizziness.  Follow up in 3 months

## 2024-04-20 NOTE — Assessment & Plan Note (Signed)
 Continue Rosivastatin 10mg  daily.Continue to work on diet and exercise as well as weight management. I recommend consuming a heart healthy diet such as Mediterranean diet or DASH diet with whole grains, fruits, vegetable, fish, lean meats, nuts, and olive oil. Limit sweets and processed foods. I also encourage moderate intensity exercise 150 minutes weekly. This is 3-5 times weekly for 30-50 minutes each session. Goal should be pace of 3 miles/hours, or walking 1.5 miles in 30 minutes. The 10-year ASCVD risk score (Arnett DK, et al., 2019) is: 4.6%

## 2024-05-07 ENCOUNTER — Other Ambulatory Visit: Payer: Self-pay | Admitting: Family Medicine

## 2024-06-15 ENCOUNTER — Other Ambulatory Visit: Payer: Self-pay | Admitting: Family Medicine

## 2024-06-16 ENCOUNTER — Other Ambulatory Visit: Payer: Self-pay | Admitting: Family Medicine

## 2024-06-16 DIAGNOSIS — E118 Type 2 diabetes mellitus with unspecified complications: Secondary | ICD-10-CM

## 2024-06-17 ENCOUNTER — Other Ambulatory Visit: Payer: Self-pay

## 2024-06-17 ENCOUNTER — Telehealth: Payer: Self-pay

## 2024-06-17 ENCOUNTER — Encounter: Payer: Self-pay | Admitting: Family Medicine

## 2024-06-17 DIAGNOSIS — M79674 Pain in right toe(s): Secondary | ICD-10-CM | POA: Diagnosis not present

## 2024-06-17 DIAGNOSIS — M79671 Pain in right foot: Secondary | ICD-10-CM | POA: Diagnosis not present

## 2024-06-17 DIAGNOSIS — E1140 Type 2 diabetes mellitus with diabetic neuropathy, unspecified: Secondary | ICD-10-CM | POA: Diagnosis not present

## 2024-06-17 DIAGNOSIS — L03031 Cellulitis of right toe: Secondary | ICD-10-CM | POA: Diagnosis not present

## 2024-06-17 DIAGNOSIS — E118 Type 2 diabetes mellitus with unspecified complications: Secondary | ICD-10-CM

## 2024-06-17 MED ORDER — MOUNJARO 2.5 MG/0.5ML ~~LOC~~ SOAJ: 2.5000 mg | SUBCUTANEOUS | 0 refills | Status: DC

## 2024-06-17 MED ORDER — METFORMIN HCL 1000 MG PO TABS: 1000.0000 mg | ORAL_TABLET | Freq: Two times a day (BID) | ORAL | 1 refills | Status: AC

## 2024-06-17 NOTE — Telephone Encounter (Signed)
 Copied from CRM #[id number]. Topic: General - Other >> Jun 17, 2024  9:45 AM Tonda B wrote: Reason for CRM: patient called and said that he would stop taking his rx metFORMIN  (GLUCOPHAGE ) 1000 MG tablet until his appt 10/30 he just wanted the provider to know if there are any concerns please call pt [phone] (M) [phone] (W) [phone] (H)

## 2024-07-20 ENCOUNTER — Other Ambulatory Visit: Payer: Self-pay

## 2024-07-20 ENCOUNTER — Telehealth: Payer: Self-pay

## 2024-07-20 DIAGNOSIS — I152 Hypertension secondary to endocrine disorders: Secondary | ICD-10-CM

## 2024-07-20 MED ORDER — LISINOPRIL 10 MG PO TABS: 10.0000 mg | ORAL_TABLET | Freq: Every day | ORAL | 1 refills | Status: AC

## 2024-07-20 NOTE — Telephone Encounter (Signed)
 Prescription Request  07/20/2024  LOV: 04/21/23  What is the name of the medication or equipment? lisinopril  (ZESTRIL ) 10 MG tablet [[id number]]   Have you contacted your pharmacy to request a refill? Yes   Which pharmacy would you like this sent to?   [id number] [address], [place] - [address] [place], [place] [address] Phone: [phone]  Fax: [phone]    Patient notified that their request is being sent to the clinical staff for review and that they should receive a response within 2 business days.   Please advise at [facility] [phone]

## 2024-07-20 NOTE — Telephone Encounter (Signed)
 Sent in medication

## 2024-07-21 ENCOUNTER — Ambulatory Visit: Admitting: Family Medicine

## 2024-07-21 VITALS — BP 128/78 | HR 98 | Temp 97.9°F | Ht 66.0 in | Wt 261.4 lb

## 2024-07-21 DIAGNOSIS — I152 Hypertension secondary to endocrine disorders: Secondary | ICD-10-CM | POA: Diagnosis not present

## 2024-07-21 DIAGNOSIS — Z7984 Long term (current) use of oral hypoglycemic drugs: Secondary | ICD-10-CM | POA: Diagnosis not present

## 2024-07-21 DIAGNOSIS — E1165 Type 2 diabetes mellitus with hyperglycemia: Secondary | ICD-10-CM

## 2024-07-21 DIAGNOSIS — E1159 Type 2 diabetes mellitus with other circulatory complications: Secondary | ICD-10-CM | POA: Diagnosis not present

## 2024-07-21 DIAGNOSIS — E119 Type 2 diabetes mellitus without complications: Secondary | ICD-10-CM | POA: Diagnosis not present

## 2024-07-21 DIAGNOSIS — Z7985 Long-term (current) use of injectable non-insulin antidiabetic drugs: Secondary | ICD-10-CM | POA: Diagnosis not present

## 2024-07-21 LAB — HEMOGLOBIN A1C
Hgb A1c MFr Bld: 9.1 % — ABNORMAL HIGH (ref <5.7)
Mean Plasma Glucose: 214 mg/dL
eAG (mmol/L): 11.9 mmol/L

## 2024-07-21 MED ORDER — TIRZEPATIDE 5 MG/0.5ML ~~LOC~~ SOAJ: 5.0000 mg | SUBCUTANEOUS | 0 refills | Status: DC

## 2024-07-21 MED ORDER — ONDANSETRON 4 MG PO TBDP: 4.0000 mg | ORAL_TABLET | Freq: Three times a day (TID) | ORAL | 0 refills | Status: DC | PRN

## 2024-07-21 NOTE — Assessment & Plan Note (Signed)
 Assessment and Plan Assessment & Plan Type 2 diabetes mellitus with gastrointestinal side effects from Mounjaro  Experienced significant nausea and diarrhea after resuming Mounjaro  at 5 mg. Abrupt dosage increase likely contributed to symptoms. A1c today - Continue Mounjaro  5 mg weekly. - Avoid greasy and fatty foods. - Consider B12 supplementation. - Prescribe Zofran  for severe nausea. - Monitor tolerance before increasing to 7.5 mg. - Plan 90-day supply refill in December if tolerated.  Hypertension Blood pressure well-controlled at 128/78 mmHg with lisinopril . - Continue lisinopril  10 mg daily.  Hyperlipidemia Mounjaro  expected to aid weight loss and improve lipid levels. - Continue rosuvastatin  10 mg daily.

## 2024-07-21 NOTE — Progress Notes (Signed)
 Subjective:  HPI: Lucas Contreras is a 48 y.o. male presenting on 07/21/2024 for Medical Management of Chronic Issues (3 mo f/u since going up on dose of Mounjaro  he would like to request something to help w/ nause around the day of injection)   HPI Patient is in today for chronic condition management and follow up.  DM: on Metformin  1000mg  BID, Mounjaro  5mg  weekly Lab Results  Component Value Date   HGBA1C 8.1 (H) 04/14/2024   HGBA1C 11.9 (H) 12/29/2023   HGBA1C 10.7 (H) 12/11/2020   HLD: on Rosuvastatin  10mg  daily Lipid Panel     Component Value Date/Time   CHOL 151 02/03/2024 0910   TRIG 116 02/03/2024 0910   HDL 49 02/03/2024 0910   CHOLHDL 3.1 02/03/2024 0910   LDLCALC 81 02/03/2024 0910   Discussed the use of AI scribe software for clinical note transcription with the patient, who gave verbal consent to proceed.  History of Present Illness Lucas Contreras is a 48 year old male with hypertension and diabetes who presents for management of blood pressure and blood sugar levels.  His blood pressure was recently measured at 128/78 mmHg, which he describes as 'good' for the first time in a long time. He continues to take lisinopril , which he believes has helped regulate his blood pressure.  This month has been challenging for his diabetes management due to dietary indiscretions during two vacations and two birthday parties. He admits to eating poorly, particularly during a family vacation and a trip to the mountains. He ran out of his diabetes medication but has since refilled it. After resuming his medication at a higher dose of 5 mg following a two-week lapse, he experienced significant nausea and diarrhea. He took two doses of the 5 mg, with the second dose causing less severe symptoms. The nausea was severe enough to wake him at night, and he vomited once. He checks his blood sugar at home two to three times a week, usually fasting in the morning. His readings have ranged from  140 to 180 mg/dL, with a peak of 759 mg/dL during his vacations. His most recent reading was 186 mg/dL.  He is currently taking a 5 mg dose of his diabetes medication once a week. He also takes omeprazole  for acid reflux, which he states is not worsened by his current medication regimen.  In terms of his mental health, he has reduced his Zoloft  intake to half a pill every two to three days, which he attributes to his wife's observation of his irritability when not taking it regularly.  No chest pain, palpitations, vision changes, recurrent headaches, leg swelling, extreme thirst, or frequent urination.    HPI 04/20/2024 Patient is in today for chronic condition management. PMH includes DM2, HLD, HTN, anxiety. Most recent LDL 81, HDL 49, total cholesterol 848, A1c 8.1% on Metformin  1000mg   BID, Mounjaro  2.5mg  weekly, Lisinopril  10mg  daily, and Rosuvastatin  10mg  daily. Tolerating Mounjaro  well without side effects. Is also working on diet and exercise improvements.  Review of Systems  All other systems reviewed and are negative.   Relevant past medical history reviewed and updated as indicated.   Past Medical History:  Diagnosis Date   Bilateral kidney stones    Diabetes mellitus without complication (HCC)    Frequency of urination    Hematuria    History of kidney stones    Hydronephrosis, bilateral    Nocturia    Right ureteral stone    Urgency of urination  Past Surgical History:  Procedure Laterality Date   BICEPS TENDON REPAIR  FEB 2012   REMOVAL CYST, LEFT LOWER LEG  2005   URETEROSCOPY  01/20/2012   Procedure: URETEROSCOPY;  Surgeon: Donnice Gwenyth Brooks, MD;  Location: [facility];  Service: Urology;  Laterality: N/A;  BILATERAL URETEROSCOPY, LASER LITHO AND RIGHT URETERAL STENTS  LASER CARM LASER AND RAD TECH OK PER MAIN     Allergies and medications reviewed and updated.   Current Outpatient Medications:    Blood Glucose Monitoring Suppl  DEVI, 1 each by Does not apply route in the morning, at noon, and at bedtime. May substitute to any manufacturer covered by patient's insurance., Disp: 1 each, Rfl: 0   lisinopril  (ZESTRIL ) 10 MG tablet, Take 1 tablet (10 mg total) by mouth daily., Disp: 90 tablet, Rfl: 1   metFORMIN  (GLUCOPHAGE ) 1000 MG tablet, Take 1 tablet (1,000 mg total) by mouth 2 (two) times daily with a meal., Disp: 180 tablet, Rfl: 1   omeprazole  (PRILOSEC) 20 MG capsule, Take 1 capsule (20 mg total) by mouth daily., Disp: 30 capsule, Rfl: 3   ondansetron  (ZOFRAN -ODT) 4 MG disintegrating tablet, Take 1 tablet (4 mg total) by mouth every 8 (eight) hours as needed for nausea., Disp: 10 tablet, Rfl: 0   rosuvastatin  (CRESTOR ) 10 MG tablet, Take 1 tablet (10 mg total) by mouth daily., Disp: 90 tablet, Rfl: 3   sertraline  (ZOLOFT ) 25 MG tablet, Take 1 tablet (25 mg total) by mouth daily., Disp: 90 tablet, Rfl: 1   tirzepatide  (MOUNJARO ) 5 MG/0.5ML Pen, Inject 5 mg into the skin once a week., Disp: 2 mL, Rfl: 0  No Known Allergies  Objective:   BP 128/78   Pulse 98   Temp 97.9 F (36.6 C)   Ht 5' 6 (1.676 m)   Wt 261 lb 6.4 oz (118.6 kg)   SpO2 97%   BMI 42.19 kg/m      07/21/2024    8:07 AM 04/20/2024    7:51 AM 03/10/2024    9:09 AM  Vitals with BMI  Height 5' 6 5' 6 5' 6  Weight 261 lbs 6 oz 257 lbs 8 oz 257 lbs 3 oz  BMI 42.21 41.58 41.53  Systolic 128 134 877  Diastolic 78 80 82  Pulse 98 99 85     Physical Exam Vitals and nursing note reviewed.  Constitutional:      Appearance: Normal appearance. He is obese.  HENT:     Head: Normocephalic and atraumatic.  Cardiovascular:     Rate and Rhythm: Normal rate and regular rhythm.     Pulses: Normal pulses.     Heart sounds: Normal heart sounds.  Pulmonary:     Effort: Pulmonary effort is normal.     Breath sounds: Normal breath sounds.  Skin:    General: Skin is warm and dry.     Capillary Refill: Capillary refill takes less than 2 seconds.   Neurological:     General: No focal deficit present.     Mental Status: He is alert and oriented to person, place, and time. Mental status is at baseline.  Psychiatric:        Mood and Affect: Mood normal.        Behavior: Behavior normal.        Thought Content: Thought content normal.        Judgment: Judgment normal.     Assessment & Plan:  Hypertension associated with diabetes (HCC)  Diabetes mellitus treated with injections of non-insulin medication (HCC) -     Hemoglobin A1c  Type 2 diabetes mellitus with hyperglycemia, without long-term current use of insulin (HCC) Assessment & Plan: Assessment and Plan Assessment & Plan Type 2 diabetes mellitus with gastrointestinal side effects from Mounjaro  Experienced significant nausea and diarrhea after resuming Mounjaro  at 5 mg. Abrupt dosage increase likely contributed to symptoms. A1c today - Continue Mounjaro  5 mg weekly. - Avoid greasy and fatty foods. - Consider B12 supplementation. - Prescribe Zofran  for severe nausea. - Monitor tolerance before increasing to 7.5 mg. - Plan 90-day supply refill in December if tolerated.  Hypertension Blood pressure well-controlled at 128/78 mmHg with lisinopril . - Continue lisinopril  10 mg daily.  Hyperlipidemia Mounjaro  expected to aid weight loss and improve lipid levels. - Continue rosuvastatin  10 mg daily.     Other orders -     Tirzepatide ; Inject 5 mg into the skin once a week.  Dispense: 2 mL; Refill: 0 -     Ondansetron ; Take 1 tablet (4 mg total) by mouth every 8 (eight) hours as needed for nausea.  Dispense: 10 tablet; Refill: 0     Follow up plan: Return in about 3 months (around 10/21/2024) for chronic follow-up with labs 1 week prior.  Jeoffrey GORMAN Barrio, FNP

## 2024-07-25 ENCOUNTER — Ambulatory Visit: Payer: Self-pay | Admitting: Family Medicine

## 2024-07-25 MED ORDER — TIRZEPATIDE 7.5 MG/0.5ML ~~LOC~~ SOAJ: 7.5000 mg | SUBCUTANEOUS | 1 refills | Status: AC

## 2024-09-27 ENCOUNTER — Telehealth: Payer: Self-pay | Admitting: Pharmacy Technician

## 2024-09-27 ENCOUNTER — Other Ambulatory Visit (HOSPITAL_COMMUNITY): Payer: Self-pay

## 2024-09-27 NOTE — Telephone Encounter (Signed)
 Pharmacy Patient Advocate Encounter   Received notification from CoverMyMeds that prior authorization for Mounjaro  7.5MG /0.5ML auto-injectors is required/requested.   Insurance verification completed.   The patient is insured through [employer].   Per test claim: PA required; PA started via CoverMyMeds. KEY [id number] . Waiting for clinical questions to populate.

## 2024-09-27 NOTE — Telephone Encounter (Signed)
 Pharmacy Patient Advocate Encounter  Received notification from [employer] that Prior Authorization for Mounjaro  7.5MG /0.5ML auto-injectors has been APPROVED from 09/27/24 to 09/27/25. Ran test claim, Copay is $25.00. This test claim was processed through [facility]- copay amounts may vary at other pharmacies due to pharmacy/plan contracts, or as the patient moves through the different stages of their insurance plan.   PA #/Case ID/Reference #: [id number]

## 2024-09-28 ENCOUNTER — Other Ambulatory Visit: Payer: Self-pay

## 2024-09-28 DIAGNOSIS — R112 Nausea with vomiting, unspecified: Secondary | ICD-10-CM

## 2024-09-28 MED ORDER — ONDANSETRON 4 MG PO TBDP: 4.0000 mg | ORAL_TABLET | Freq: Three times a day (TID) | ORAL | 0 refills | Status: AC | PRN

## 2024-10-15 ENCOUNTER — Other Ambulatory Visit: Payer: Self-pay | Admitting: Family Medicine

## 2024-10-21 ENCOUNTER — Other Ambulatory Visit

## 2024-10-21 DIAGNOSIS — E1169 Type 2 diabetes mellitus with other specified complication: Secondary | ICD-10-CM

## 2024-10-21 DIAGNOSIS — E1165 Type 2 diabetes mellitus with hyperglycemia: Secondary | ICD-10-CM

## 2024-10-21 DIAGNOSIS — I152 Hypertension secondary to endocrine disorders: Secondary | ICD-10-CM

## 2024-10-22 LAB — CBC WITH DIFFERENTIAL/PLATELET
Absolute Lymphocytes: 3121 {cells}/uL (ref 850–3900)
Absolute Monocytes: 521 {cells}/uL (ref 200–950)
Basophils Absolute: 32 {cells}/uL (ref 0–200)
Basophils Relative: 0.4 %
Eosinophils Absolute: 229 {cells}/uL (ref 15–500)
Eosinophils Relative: 2.9 %
HCT: 45 % (ref 39.4–51.1)
Hemoglobin: 14.8 g/dL (ref 13.2–17.1)
MCH: 29.2 pg (ref 27.0–33.0)
MCHC: 32.9 g/dL (ref 31.6–35.4)
MCV: 88.8 fL (ref 81.4–101.7)
MPV: 10 fL (ref 7.5–12.5)
Monocytes Relative: 6.6 %
Neutro Abs: 3997 {cells}/uL (ref 1500–7800)
Neutrophils Relative %: 50.6 %
Platelets: 348 10*3/uL (ref 140–400)
RBC: 5.07 Million/uL (ref 4.20–5.80)
RDW: 12.8 % (ref 11.0–15.0)
Total Lymphocyte: 39.5 %
WBC: 7.9 10*3/uL (ref 3.8–10.8)

## 2024-10-22 LAB — COMPREHENSIVE METABOLIC PANEL WITH GFR
AG Ratio: 1.8 (calc) (ref 1.0–2.5)
ALT: 32 U/L (ref 9–46)
AST: 21 U/L (ref 10–40)
Albumin: 4.6 g/dL (ref 3.6–5.1)
Alkaline phosphatase (APISO): 49 U/L (ref 36–130)
BUN: 20 mg/dL (ref 7–25)
CO2: 30 mmol/L (ref 20–32)
Calcium: 9.9 mg/dL (ref 8.6–10.3)
Chloride: 101 mmol/L (ref 98–110)
Creat: 0.85 mg/dL (ref 0.60–1.29)
Globulin: 2.5 g/dL (ref 1.9–3.7)
Glucose, Bld: 146 mg/dL — ABNORMAL HIGH (ref 65–99)
Potassium: 5.1 mmol/L (ref 3.5–5.3)
Sodium: 137 mmol/L (ref 135–146)
Total Bilirubin: 0.5 mg/dL (ref 0.2–1.2)
Total Protein: 7.1 g/dL (ref 6.1–8.1)
eGFR: 107 mL/min/{1.73_m2}

## 2024-10-22 LAB — HEMOGLOBIN A1C
Hgb A1c MFr Bld: 7.2 % — ABNORMAL HIGH
Mean Plasma Glucose: 160 mg/dL
eAG (mmol/L): 8.9 mmol/L

## 2024-10-22 LAB — LIPID PANEL
Cholesterol: 147 mg/dL
HDL: 45 mg/dL
LDL Cholesterol (Calc): 80 mg/dL
Non-HDL Cholesterol (Calc): 102 mg/dL
Total CHOL/HDL Ratio: 3.3 (calc)
Triglycerides: 128 mg/dL

## 2024-10-24 ENCOUNTER — Ambulatory Visit: Payer: Self-pay | Admitting: Family Medicine

## 2024-10-24 ENCOUNTER — Ambulatory Visit: Admitting: Family Medicine

## 2024-11-02 ENCOUNTER — Ambulatory Visit: Admitting: Family Medicine
